# Patient Record
Sex: Female | Born: 1980
Health system: Southern US, Community
[De-identification: ages and names within clinical notes are randomized; demographics above are authoritative.]

## PROBLEM LIST (undated history)

## (undated) ENCOUNTER — Inpatient Hospital Stay (HOSPITAL_COMMUNITY): Payer: Self-pay

## (undated) DIAGNOSIS — D649 Anemia, unspecified: Secondary | ICD-10-CM

## (undated) DIAGNOSIS — N83209 Unspecified ovarian cyst, unspecified side: Secondary | ICD-10-CM

## (undated) DIAGNOSIS — R002 Palpitations: Secondary | ICD-10-CM

## (undated) DIAGNOSIS — R87629 Unspecified abnormal cytological findings in specimens from vagina: Secondary | ICD-10-CM

## (undated) DIAGNOSIS — I10 Essential (primary) hypertension: Secondary | ICD-10-CM

## (undated) HISTORY — PX: TUBAL LIGATION: SHX77

## (undated) HISTORY — PX: LEEP: SHX91

## (undated) HISTORY — PX: INDUCED ABORTION: SHX677

## (undated) HISTORY — PX: TONSILLECTOMY: SUR1361

---

## 2000-09-30 ENCOUNTER — Other Ambulatory Visit: Admission: RE | Admit: 2000-09-30 | Discharge: 2000-09-30 | Payer: Self-pay | Admitting: Obstetrics

## 2000-09-30 ENCOUNTER — Inpatient Hospital Stay (HOSPITAL_COMMUNITY): Admission: AD | Admit: 2000-09-30 | Discharge: 2000-09-30 | Payer: Self-pay | Admitting: Obstetrics & Gynecology

## 2000-10-08 ENCOUNTER — Observation Stay (HOSPITAL_COMMUNITY): Admission: AD | Admit: 2000-10-08 | Discharge: 2000-10-09 | Payer: Self-pay | Admitting: *Deleted

## 2000-10-08 ENCOUNTER — Encounter: Payer: Self-pay | Admitting: *Deleted

## 2000-12-09 ENCOUNTER — Emergency Department (HOSPITAL_COMMUNITY): Admission: EM | Admit: 2000-12-09 | Discharge: 2000-12-09 | Payer: Self-pay | Admitting: Emergency Medicine

## 2000-12-13 ENCOUNTER — Encounter: Payer: Self-pay | Admitting: Emergency Medicine

## 2000-12-13 ENCOUNTER — Emergency Department (HOSPITAL_COMMUNITY): Admission: EM | Admit: 2000-12-13 | Discharge: 2000-12-13 | Payer: Self-pay | Admitting: Emergency Medicine

## 2001-12-26 ENCOUNTER — Inpatient Hospital Stay (HOSPITAL_COMMUNITY): Admission: AD | Admit: 2001-12-26 | Discharge: 2001-12-26 | Payer: Self-pay | Admitting: *Deleted

## 2002-04-24 ENCOUNTER — Inpatient Hospital Stay (HOSPITAL_COMMUNITY): Admission: AD | Admit: 2002-04-24 | Discharge: 2002-04-24 | Payer: Self-pay | Admitting: *Deleted

## 2002-04-24 ENCOUNTER — Encounter: Payer: Self-pay | Admitting: *Deleted

## 2002-06-21 ENCOUNTER — Other Ambulatory Visit: Admission: RE | Admit: 2002-06-21 | Discharge: 2002-06-21 | Payer: Self-pay | Admitting: Obstetrics and Gynecology

## 2002-06-21 ENCOUNTER — Other Ambulatory Visit: Admission: RE | Admit: 2002-06-21 | Discharge: 2002-06-21 | Payer: Self-pay | Admitting: *Deleted

## 2002-08-15 ENCOUNTER — Inpatient Hospital Stay (HOSPITAL_COMMUNITY): Admission: AD | Admit: 2002-08-15 | Discharge: 2002-08-15 | Payer: Self-pay | Admitting: Obstetrics and Gynecology

## 2002-11-16 ENCOUNTER — Inpatient Hospital Stay (HOSPITAL_COMMUNITY): Admission: AD | Admit: 2002-11-16 | Discharge: 2002-11-18 | Payer: Self-pay | Admitting: Obstetrics and Gynecology

## 2005-01-12 ENCOUNTER — Ambulatory Visit: Payer: Self-pay | Admitting: Obstetrics and Gynecology

## 2005-02-11 ENCOUNTER — Ambulatory Visit: Payer: Self-pay | Admitting: Obstetrics and Gynecology

## 2005-02-11 ENCOUNTER — Other Ambulatory Visit: Admission: RE | Admit: 2005-02-11 | Discharge: 2005-02-11 | Payer: Self-pay | Admitting: Obstetrics and Gynecology

## 2005-02-11 ENCOUNTER — Encounter (INDEPENDENT_AMBULATORY_CARE_PROVIDER_SITE_OTHER): Payer: Self-pay | Admitting: Specialist

## 2005-02-18 ENCOUNTER — Ambulatory Visit: Payer: Self-pay | Admitting: Obstetrics and Gynecology

## 2005-03-21 ENCOUNTER — Emergency Department (HOSPITAL_COMMUNITY): Admission: EM | Admit: 2005-03-21 | Discharge: 2005-03-21 | Payer: Self-pay | Admitting: Emergency Medicine

## 2006-11-03 ENCOUNTER — Emergency Department (HOSPITAL_COMMUNITY): Admission: EM | Admit: 2006-11-03 | Discharge: 2006-11-03 | Payer: Self-pay | Admitting: Emergency Medicine

## 2007-02-06 ENCOUNTER — Inpatient Hospital Stay (HOSPITAL_COMMUNITY): Admission: AD | Admit: 2007-02-06 | Discharge: 2007-02-07 | Payer: Self-pay | Admitting: Obstetrics & Gynecology

## 2007-02-13 ENCOUNTER — Other Ambulatory Visit: Admission: RE | Admit: 2007-02-13 | Discharge: 2007-02-13 | Payer: Self-pay | Admitting: Obstetrics and Gynecology

## 2007-03-22 ENCOUNTER — Inpatient Hospital Stay (HOSPITAL_COMMUNITY): Admission: AD | Admit: 2007-03-22 | Discharge: 2007-03-22 | Payer: Self-pay | Admitting: Obstetrics & Gynecology

## 2007-03-22 ENCOUNTER — Ambulatory Visit: Payer: Self-pay | Admitting: Physician Assistant

## 2007-04-05 ENCOUNTER — Ambulatory Visit: Payer: Self-pay | Admitting: *Deleted

## 2007-04-19 ENCOUNTER — Ambulatory Visit: Payer: Self-pay | Admitting: *Deleted

## 2007-04-25 ENCOUNTER — Inpatient Hospital Stay (HOSPITAL_COMMUNITY): Admission: AD | Admit: 2007-04-25 | Discharge: 2007-04-26 | Payer: Self-pay | Admitting: Gynecology

## 2007-04-25 ENCOUNTER — Ambulatory Visit: Payer: Self-pay | Admitting: Physician Assistant

## 2007-05-10 ENCOUNTER — Ambulatory Visit: Payer: Self-pay | Admitting: Obstetrics & Gynecology

## 2007-05-24 ENCOUNTER — Ambulatory Visit: Payer: Self-pay | Admitting: *Deleted

## 2007-05-31 ENCOUNTER — Ambulatory Visit: Payer: Self-pay | Admitting: Obstetrics & Gynecology

## 2007-06-07 ENCOUNTER — Ambulatory Visit: Payer: Self-pay | Admitting: Obstetrics & Gynecology

## 2007-06-14 ENCOUNTER — Ambulatory Visit: Payer: Self-pay | Admitting: *Deleted

## 2007-06-14 ENCOUNTER — Ambulatory Visit: Payer: Self-pay | Admitting: Obstetrics & Gynecology

## 2007-06-14 ENCOUNTER — Inpatient Hospital Stay (HOSPITAL_COMMUNITY): Admission: AD | Admit: 2007-06-14 | Discharge: 2007-06-15 | Payer: Self-pay | Admitting: Obstetrics and Gynecology

## 2008-03-23 ENCOUNTER — Inpatient Hospital Stay (HOSPITAL_COMMUNITY): Admission: AD | Admit: 2008-03-23 | Discharge: 2008-03-24 | Payer: Self-pay | Admitting: Family Medicine

## 2010-03-23 ENCOUNTER — Emergency Department (HOSPITAL_COMMUNITY): Admission: EM | Admit: 2010-03-23 | Discharge: 2010-03-23 | Payer: Self-pay | Admitting: Emergency Medicine

## 2010-12-04 NOTE — Group Therapy Note (Signed)
Allison Drake, SCHIRALDI NO.:  [id number]   MEDICAL RECORD NO.:  [id number]          PATIENT TYPE:  WOC   LOCATION:  [facility]                   FACILITY:  [facility]   PHYSICIAN:  Argentina Donovan, MD        DATE OF BIRTH:  April 11, 1981   DATE OF SERVICE:                                    CLINIC NOTE   HISTORY OF PRESENT ILLNESS:  The patient is a 30 year old gravida 1, para 1-  0-0-1 on Depo-Provera who had an abnormal Pap smear and then a colposcopy at  the health department showing severe dysplasia, CIN 3.  We have discussed in  detail with the patient the etiology, the progress of the disease and the  necessity for her to quit smoking.  She has seen the film on LEEP, and we  are going to schedule her for a LEEP conization of the cervix.   IMPRESSION:  1.  Cervical intraepithelial neoplasia, grade 3.  2.  Severe dysplasia.       PR/MEDQ  D:  01/12/2005  T:  01/12/2005  Job:  [id number]

## 2010-12-04 NOTE — H&P (Signed)
[name], Allison Drake                         ACCOUNT NO.:  [id number]   MEDICAL RECORD NO.:  [id number]                   PATIENT TYPE:  INP   LOCATION:  9165                                 [facility]ACILITY:  [facility]   PHYSICIAN:  Crist Fat. Rivard, M.D.              DATE OF BIRTH:  03-Sep-1980   DATE OF ADMISSION:  11/16/2002  DATE OF DISCHARGE:                                HISTORY & PHYSICAL   HISTORY OF PRESENT ILLNESS:  This is a 30 year old gravida 1, para 0, at 77-  6/7 weeks who presents with complaints of regular uterine contractions since  3 a.m.  She denies leaking or bleeding, reports positive fetal movement.  After arrival to the hospital, her membranes ruptured spontaneously for  clear fluid.  Pregnancy has been followed by the nurse midwife service, and  remarkable for late to care, first trimester marijuana, history of  cryosurgery, history of irregular heart beat, first trimester dental x-ray,  sickle cell trait, Group B Strep positive.   OBSTETRICAL HISTORY:  The patient is primigravida.   PAST MEDICAL HISTORY:  1. History of abnormal Pap smear with cryosurgery in 2002.  2. History of Chlamydia in 2002 which was treated.  3. History of adulthood varicella.  4. History of an irregular heart beat which has been evaluated in the past     with no significant findings.  5. The patient was a previous smoker, but stopped in 10/03.   FAMILY HISTORY:  Remarkable for heart disease in her father.  Stroke in her  grandfather.  Migraines in her aunt.   PAST SURGICAL HISTORY:  Tonsillectomy.   GENETIC HISTORY:  Remarkable for the patient's brother's children who have  extra digits, and the patient who has sickle cell trait.  The patient's  father has sickle cell trait.   SOCIAL HISTORY:  The patient is single.  Was involved with [employer]es and  Narda Amber.  She does not report a religious affiliation.  She works  in Restaurant manager, fast food.  She denies any tobacco, alcohol, or drug use  currently.   PHYSICAL EXAMINATION:  VITAL SIGNS:  Stable, afebrile.  HEENT:  Within normal limits.  NECK:  Thyroid normal and not enlarged.  LUNGS:  Clear to auscultation.  HEART:  Regular rate and rhythm.  ABDOMEN:  Gravid at 38 cm, EFM shows a non-reactive, but generally  reassuring fetal heart rate.  Uterine contractions every two to four  minutes, mild to moderate.  PELVIC:  Cervical examination:  1 to 2 cm, 75% effaced, -2 station, and  vertex presentation.  Rupture of membranes, clear fluid leaking from the  vagina.  EXTREMITIES:  Within normal limits.   ASSESSMENT:  1. Intrauterine pregnancy at 38-3/7 weeks.  2. Early labor.  3. Spontaneous rupture of membranes.  4. Group B Strep positive.   PLAN:  1. Admit to birthing suite.  2. Routine CMN orders.  3. Penicillin prophylaxis.  Marie L. Williams, C.N.M.                 Crist Fat Rivard, M.D.    MLW/MEDQ  D:  11/16/2002  T:  11/16/2002  Job:  [id number]

## 2011-04-21 LAB — URINE MICROSCOPIC-ADD ON

## 2011-04-21 LAB — POCT PREGNANCY, URINE: Preg Test, Ur: NEGATIVE

## 2011-04-21 LAB — URINALYSIS, ROUTINE W REFLEX MICROSCOPIC
Bilirubin Urine: NEGATIVE
Glucose, UA: NEGATIVE
Ketones, ur: 15 — AB
Specific Gravity, Urine: >1.03 — ABNORMAL HIGH
pH: 6

## 2011-04-21 LAB — GC/CHLAMYDIA PROBE AMP, GENITAL: GC Probe Amp, Genital: NEGATIVE

## 2011-04-27 LAB — POCT URINALYSIS DIP (DEVICE)
Bilirubin Urine: NEGATIVE
Glucose, UA: NEGATIVE
Ketones, ur: NEGATIVE
Nitrite: NEGATIVE
Operator id: 297281
Operator id: 148111
Operator id: 29728
Protein, ur: NEGATIVE
Specific Gravity, Urine: 1.02
Specific Gravity, Urine: 1.025
Specific Gravity, Urine: >=1.03
Urobilinogen, UA: 2 — ABNORMAL HIGH

## 2011-04-27 LAB — CBC
Hemoglobin: 11.4 — ABNORMAL LOW
MCHC: 34.4
MCV: 81.6
RBC: 4.06
WBC: 18.1 — ABNORMAL HIGH

## 2011-04-27 LAB — RPR: RPR Ser Ql: NONREACTIVE

## 2011-04-28 LAB — POCT URINALYSIS DIP (DEVICE)
Glucose, UA: NEGATIVE
Ketones, ur: 40 — AB
Operator id: 120861
Specific Gravity, Urine: >=1.03

## 2011-04-29 LAB — URINALYSIS, ROUTINE W REFLEX MICROSCOPIC
Ketones, ur: NEGATIVE
Nitrite: NEGATIVE
pH: 5.5

## 2011-04-29 LAB — POCT URINALYSIS DIP (DEVICE)
Bilirubin Urine: NEGATIVE
Glucose, UA: NEGATIVE
Ketones, ur: NEGATIVE
Operator id: 120861
Protein, ur: NEGATIVE
Specific Gravity, Urine: 1.02

## 2011-04-29 LAB — GC/CHLAMYDIA PROBE AMP, GENITAL: Chlamydia, DNA Probe: NEGATIVE

## 2011-04-29 LAB — WET PREP, GENITAL: Clue Cells Wet Prep HPF POC: NONE SEEN

## 2011-04-29 LAB — URINE MICROSCOPIC-ADD ON

## 2011-05-03 LAB — GC/CHLAMYDIA PROBE AMP, GENITAL
Chlamydia, DNA Probe: NEGATIVE
GC Probe Amp, Genital: NEGATIVE

## 2011-05-03 LAB — URINALYSIS, ROUTINE W REFLEX MICROSCOPIC
Ketones, ur: NEGATIVE
Nitrite: NEGATIVE
Protein, ur: NEGATIVE
pH: 6.5

## 2011-05-03 LAB — URINE CULTURE

## 2011-05-03 LAB — CBC
HCT: 36
Hemoglobin: 12.1
MCHC: 33.4
MCV: 81.6
RDW: 12.7

## 2011-09-30 ENCOUNTER — Encounter (HOSPITAL_COMMUNITY): Payer: Self-pay | Admitting: *Deleted

## 2011-09-30 ENCOUNTER — Emergency Department (HOSPITAL_COMMUNITY)
Admission: EM | Admit: 2011-09-30 | Discharge: 2011-09-30 | Disposition: A | Discharge status: Home / Self Care | Payer: Medicaid Other | Attending: Emergency Medicine | Admitting: Emergency Medicine

## 2011-09-30 DIAGNOSIS — K089 Disorder of teeth and supporting structures, unspecified: Secondary | ICD-10-CM | POA: Insufficient documentation

## 2011-09-30 DIAGNOSIS — F17200 Nicotine dependence, unspecified, uncomplicated: Secondary | ICD-10-CM | POA: Insufficient documentation

## 2011-09-30 DIAGNOSIS — K0889 Other specified disorders of teeth and supporting structures: Secondary | ICD-10-CM

## 2011-09-30 MED ORDER — PENICILLIN V POTASSIUM 250 MG PO TABS: 250.0000 mg | ORAL_TABLET | Freq: Four times a day (QID) | ORAL | Status: AC

## 2011-09-30 MED ORDER — HYDROCODONE-ACETAMINOPHEN 5-325 MG PO TABS: 1.0000 | ORAL_TABLET | ORAL | Status: DC | PRN

## 2011-09-30 MED ORDER — HYDROCODONE-ACETAMINOPHEN 5-325 MG PO TABS: 1.0000 | ORAL_TABLET | Freq: Four times a day (QID) | ORAL | Status: AC | PRN

## 2011-09-30 MED ORDER — PENICILLIN V POTASSIUM 250 MG PO TABS
250.0000 mg | ORAL_TABLET | Freq: Once | ORAL | Status: AC
  Administered 2011-09-30: 250 mg via ORAL
  Filled 2011-09-30: qty 1

## 2011-09-30 NOTE — ED Provider Notes (Signed)
History     CSN: [id number]  Arrival date & time 09/30/11  1940   First MD Initiated Contact with Patient 09/30/11 2204      Chief Complaint  Patient presents with  . Dental Pain    (Consider location/radiation/quality/duration/timing/severity/associated sxs/prior treatment) Patient is a 31 y.o. female presenting with tooth pain. The history is provided by the patient. No language interpreter was used.  Dental PainThe primary symptoms include mouth pain. The symptoms began 5 to 7 days ago. The symptoms are worsening. The symptoms are recurrent. The symptoms occur frequently.  Additional symptoms include: dental sensitivity to temperature and jaw pain. Additional symptoms do not include: trouble swallowing and swollen glands.  Patient reports intermittent pain to right upper maxilla for many months.  Filling has fallen out of previously repaired tooth.  Patient does not currently have a dentist.  History reviewed. No pertinent past medical history.  History reviewed. No pertinent past surgical history.  History reviewed. No pertinent family history.  History  Substance Use Topics  . Smoking status: Current Everyday Smoker  . Smokeless tobacco: Not on file  . Alcohol Use: No    OB History    Grav Para Term Preterm Abortions TAB SAB Ect Mult Living                  Review of Systems  HENT: Positive for dental problem. Negative for trouble swallowing.   All other systems reviewed and are negative.    Allergies  Review of patient's allergies indicates no known allergies.  Home Medications   Current Outpatient Rx  Name Route Sig Dispense Refill  . ACETAMINOPHEN 500 MG PO TABS Oral Take 1,000 mg by mouth every 6 (six) hours as needed. For pain    . BC HEADACHE POWDER PO Oral Take 2 packets by mouth daily as needed. For pain      BP 133/82  Pulse 68  Temp(Src) 98.2 F (36.8 C) (Oral)  Resp 18  SpO2 100%  Physical Exam  Nursing note and vitals  reviewed. Constitutional: She is oriented to person, place, and time. She appears well-developed and well-nourished.  HENT:  Head: Normocephalic.  Mouth/Throat:    Eyes: Pupils are equal, round, and reactive to light.  Neck: Normal range of motion. Neck supple.  Cardiovascular: Normal rate, regular rhythm, normal heart sounds and intact distal pulses.   Pulmonary/Chest: Effort normal and breath sounds normal.  Abdominal: Soft. Bowel sounds are normal.  Musculoskeletal: Normal range of motion.  Lymphadenopathy:    She has no cervical adenopathy.  Neurological: She is alert and oriented to person, place, and time.  Skin: Skin is warm and dry.  Psychiatric: She has a normal mood and affect. Her behavior is normal. Judgment and thought content normal.    ED Course  Procedures (including critical care time)  Labs Reviewed - No data to display No results found.   No diagnosis found.  Dental pain.  MDM          Jimmye Norman, NP 09/30/11 [phone]

## 2011-09-30 NOTE — ED Notes (Signed)
States her filling bell out over a year. States she does not have dental insurance and has been taking otc for the pain. She took to [facility] powder with not relief. Rates pain as 7/10. Located on right upper.

## 2011-09-30 NOTE — ED Provider Notes (Signed)
Medical screening examination/treatment/procedure(s) were performed by non-physician practitioner and as supervising physician I was immediately available for consultation/collaboration.  Juliet Rude. Rubin Payor, MD 09/30/11 [phone]

## 2011-09-30 NOTE — ED Notes (Signed)
Toothache for the past hour

## 2011-09-30 NOTE — Discharge Instructions (Signed)
Dental Pain A tooth ache may be caused by cavities (tooth decay). Cavities expose the nerve of the tooth to air and hot or cold temperatures. It may come from an infection or abscess (also called a boil or furuncle) around your tooth. It is also often caused by dental caries (tooth decay). This causes the pain you are having. DIAGNOSIS  Your caregiver can diagnose this problem by exam. TREATMENT   If caused by an infection, it may be treated with medications which kill germs (antibiotics) and pain medications as prescribed by your caregiver. Take medications as directed.   Only take over-the-counter or prescription medicines for pain, discomfort, or fever as directed by your caregiver.   Whether the tooth ache today is caused by infection or dental disease, you should see your dentist as soon as possible for further care.  SEEK MEDICAL CARE IF: The exam and treatment you received today has been provided on an emergency basis only. This is not a substitute for complete medical or dental care. If your problem worsens or new problems (symptoms) appear, and you are unable to meet with your dentist, call or return to this location. SEEK IMMEDIATE MEDICAL CARE IF:   You have a fever.   You develop redness and swelling of your face, jaw, or neck.   You are unable to open your mouth.   You have severe pain uncontrolled by pain medicine.  MAKE SURE YOU:   Understand these instructions.   Will watch your condition.   Will get help right away if you are not doing well or get worse.  Document Released: 07/05/2005 Document Revised: 06/24/2011 Document Reviewed: 02/21/2008 [facility] Patient Information 2012 [place], [place].  BE SURE TO CALL THE DENTIST TOMORROW--IF THE OFFICE IS NOT OPEN, CALL FIRST THING Monday morning.

## 2013-07-19 NOTE — L&D Delivery Note (Signed)
I was present for delivery and agree with note above. Walidah N Muhammad, CNM  

## 2013-07-19 NOTE — L&D Delivery Note (Signed)
Delivery Note At 9:14 AM a viable female was delivered via Vaginal, Spontaneous Delivery (Presentation: ; Occiput Anterior).  APGAR: 9, 9; weight .   Placenta status: Intact, Spontaneous.  Cord: 3 vessels with the following complications: None.  Cord pH: n/a   Anesthesia: None  Episiotomy: n/a Lacerations: n/a  Suture Repair: n/a Est. Blood Loss (mL):   Mom to postpartum.  Baby to Couplet care / Skin to Skin.  Pt pushed with good maternal effort to deliver a liveborn female via NSVD with spontaneous cry.   Baby placed on maternal abdomen.  Delayed cord clamping performed.  Cord cut by father.  Placenta delivered intact with 3V cord via traction and pitocin.  No complications.  Mom and baby to postpartum.   Rosemarie Ax 12/04/2013, 9:47 AM

## 2013-09-12 ENCOUNTER — Inpatient Hospital Stay (HOSPITAL_COMMUNITY)
Admission: AD | Admit: 2013-09-12 | Discharge: 2013-09-12 | Disposition: A | Discharge status: Home / Self Care | Payer: Medicaid Other | Source: Ambulatory Visit | Attending: Obstetrics and Gynecology | Admitting: Obstetrics and Gynecology

## 2013-09-12 ENCOUNTER — Encounter (HOSPITAL_COMMUNITY): Payer: Self-pay

## 2013-09-12 DIAGNOSIS — O99330 Smoking (tobacco) complicating pregnancy, unspecified trimester: Secondary | ICD-10-CM | POA: Diagnosis not present

## 2013-09-12 DIAGNOSIS — O2390 Unspecified genitourinary tract infection in pregnancy, unspecified trimester: Secondary | ICD-10-CM | POA: Insufficient documentation

## 2013-09-12 DIAGNOSIS — N390 Urinary tract infection, site not specified: Secondary | ICD-10-CM | POA: Diagnosis not present

## 2013-09-12 DIAGNOSIS — O368390 Maternal care for abnormalities of the fetal heart rate or rhythm, unspecified trimester, not applicable or unspecified: Secondary | ICD-10-CM | POA: Diagnosis not present

## 2013-09-12 HISTORY — DX: Palpitations: R00.2

## 2013-09-12 HISTORY — DX: Unspecified abnormal cytological findings in specimens from vagina: R87.629

## 2013-09-12 LAB — RAPID URINE DRUG SCREEN, HOSP PERFORMED
Amphetamines: NOT DETECTED
Barbiturates: NOT DETECTED
Benzodiazepines: NOT DETECTED
COCAINE: NOT DETECTED
OPIATES: NOT DETECTED
Tetrahydrocannabinol: NOT DETECTED

## 2013-09-12 LAB — COMPREHENSIVE METABOLIC PANEL
ALBUMIN: 3.1 g/dL — AB (ref 3.5–5.2)
ALK PHOS: 64 U/L (ref 39–117)
ALT: 13 U/L (ref 0–35)
AST: 14 U/L (ref 0–37)
BILIRUBIN TOTAL: 0.6 mg/dL (ref 0.3–1.2)
BUN: 5 mg/dL — AB (ref 6–23)
CHLORIDE: 97 meq/L (ref 96–112)
CO2: 22 mEq/L (ref 19–32)
Calcium: 8.9 mg/dL (ref 8.4–10.5)
Creatinine, Ser: 0.52 mg/dL (ref 0.50–1.10)
GFR calc Af Amer: >90 mL/min (ref >90)
GFR calc non Af Amer: >90 mL/min (ref >90)
GLUCOSE: 81 mg/dL (ref 70–99)
POTASSIUM: 4.2 meq/L (ref 3.7–5.3)
SODIUM: 133 meq/L — AB (ref 137–147)
TOTAL PROTEIN: 6.6 g/dL (ref 6.0–8.3)

## 2013-09-12 LAB — CBC
HEMATOCRIT: 29.3 % — AB (ref 36.0–46.0)
HEMOGLOBIN: 10.3 g/dL — AB (ref 12.0–15.0)
MCH: 27.6 pg (ref 26.0–34.0)
MCHC: 35.2 g/dL (ref 30.0–36.0)
MCV: 78.6 fL (ref 78.0–100.0)
Platelets: 215 10*3/uL (ref 150–400)
RBC: 3.73 MIL/uL — ABNORMAL LOW (ref 3.87–5.11)
RDW: 13.8 % (ref 11.5–15.5)
WBC: 24 10*3/uL — AB (ref 4.0–10.5)

## 2013-09-12 LAB — URINE MICROSCOPIC-ADD ON

## 2013-09-12 LAB — URINALYSIS, ROUTINE W REFLEX MICROSCOPIC
GLUCOSE, UA: NEGATIVE mg/dL
Hgb urine dipstick: NEGATIVE
KETONES UR: 40 mg/dL — AB
NITRITE: NEGATIVE
PH: 6 (ref 5.0–8.0)
Protein, ur: NEGATIVE mg/dL
SPECIFIC GRAVITY, URINE: 1.025 (ref 1.005–1.030)
Urobilinogen, UA: 4 mg/dL — ABNORMAL HIGH (ref 0.0–1.0)

## 2013-09-12 MED ORDER — ONDANSETRON HCL 4 MG PO TABS: 4.0000 mg | ORAL_TABLET | Freq: Three times a day (TID) | ORAL | Status: DC | PRN

## 2013-09-12 MED ORDER — ACETAMINOPHEN 500 MG PO TABS
1000.0000 mg | ORAL_TABLET | Freq: Four times a day (QID) | ORAL | Status: DC | PRN
  Administered 2013-09-12: 1000 mg via ORAL
  Filled 2013-09-12: qty 2

## 2013-09-12 MED ORDER — THERA VITAL M PO TABS: 1.0000 | ORAL_TABLET | Freq: Every day | ORAL | Status: DC

## 2013-09-12 MED ORDER — CEFTRIAXONE SODIUM 1 G IJ SOLR
1.0000 g | INTRAMUSCULAR | Status: DC
  Filled 2013-09-12: qty 10

## 2013-09-12 MED ORDER — ONDANSETRON HCL 4 MG PO TABS
4.0000 mg | ORAL_TABLET | Freq: Once | ORAL | Status: AC
  Administered 2013-09-12: 4 mg via ORAL
  Filled 2013-09-12: qty 1

## 2013-09-12 MED ORDER — CEPHALEXIN 500 MG PO CAPS: 500.0000 mg | ORAL_CAPSULE | Freq: Three times a day (TID) | ORAL | Status: DC

## 2013-09-12 MED ORDER — CEFTRIAXONE SODIUM 1 G IJ SOLR
1.0000 g | Freq: Once | INTRAMUSCULAR | Status: AC
  Administered 2013-09-12: 1 g via INTRAMUSCULAR
  Filled 2013-09-12: qty 10

## 2013-09-12 NOTE — MAU Provider Note (Signed)
Chief Complaint:  Abdominal Pain and Back Pain  Allison Drake is a 33 y.o.  [id number] with IUP at unknown [place] (LMP ~ July '14) with no prenatal care presenting for Abdominal Pain and Back Pain She reports diffuse abdominal pain starting at 10:00 last night. The pain has been irregular but similar to previous contractions, and accompanied by lower back pain that has been present for the past several weeks. She denies vaginal bleeding or LOF, and notes good fetal movement for the last 1-2 months. She denies any fevers, cough, dysuria or vomiting. She report nausea and feels like she is dehydrated due to decreased fluid intake. She continues to smoke but denies alcohol or illicit drug use.   Menstrual History: OB History   Grav Para Term Preterm Abortions TAB SAB Ect Mult Living   4 2 2  1 1  0 0 0 2      Menarche age:  Patient's last menstrual period was 01/31/2013.    Past Medical History  Diagnosis Date  . Palpitations   . Vaginal Pap smear, abnormal     LEEP    Past Surgical History  Procedure Laterality Date  . Induced abortion      Family History  Problem Relation Age of Onset  . Cancer Maternal Uncle   . Miscarriages / Stillbirths Maternal Grandmother   . Heart disease Maternal Grandmother   . Miscarriages / Stillbirths Maternal Grandfather   . Heart disease Maternal Grandfather   . Heart disease Paternal Grandfather   . Miscarriages / Stillbirths Paternal Grandfather     History  Substance Use Topics  . Smoking status: Current Every Day Smoker -- 0.50 packs/day for 14 years    Types: Cigarettes  . Smokeless tobacco: Never Used  . Alcohol Use: No     No Known Allergies  No prescriptions prior to admission   Review of Systems - Negative except for what is mentioned in HPI.  Physical Exam  Blood pressure 103/46, pulse 105, temperature 99.9 F (37.7 C), temperature source Oral, resp. rate 18, height 5\' 9"  (1.753 m), weight 87.454 kg (192 lb 12.8 oz), last  menstrual period 01/31/2013, SpO2 100.00%. GENERAL: Well-developed, well-nourished female in no acute distress.  LUNGS: Clear to auscultation bilaterally.  HEART: Mild tachycardia in 110s w/ Regular rhythm; No m/r/g; peripheral pulse 2+, CR < 3 secs ABDOMEN: Soft, nontender, nondistended, gravid. Fundal height 26 cm  Back: No CVA tenderness EXTREMITIES: Nontender, no edema, 2+ distal pulses. FHT:  Baseline rate 165-170 bpm   Variability moderate  Accelerations absent   Decelerations none Contractions: None on monitor   Labs: Results for orders placed during the hospital encounter of 09/12/13 (from the past 24 hour(s))  URINALYSIS, ROUTINE W REFLEX MICROSCOPIC   Collection Time    09/12/13  5:29 PM      Result Value Ref Range   Color, Urine AMBER (*) YELLOW   APPearance CLEAR  CLEAR   Specific Gravity, Urine 1.025  1.005 - 1.030   pH 6.0  5.0 - 8.0   Glucose, UA NEGATIVE  NEGATIVE mg/dL   Hgb urine dipstick NEGATIVE  NEGATIVE   Bilirubin Urine SMALL (*) NEGATIVE   Ketones, ur 40 (*) NEGATIVE mg/dL   Protein, ur NEGATIVE  NEGATIVE mg/dL   Urobilinogen, UA 4.0 (*) 0.0 - 1.0 mg/dL   Nitrite NEGATIVE  NEGATIVE   Leukocytes, UA MODERATE (*) NEGATIVE  URINE MICROSCOPIC-ADD ON   Collection Time    09/12/13  5:29 PM  Result Value Ref Range   Squamous Epithelial / LPF FEW (*) RARE   WBC, UA 11-20  <3 WBC/hpf   RBC / HPF 3-6  <3 RBC/hpf   Bacteria, UA MANY (*) RARE   Urine-Other MUCOUS PRESENT    CBC   Collection Time    09/12/13  5:40 PM      Result Value Ref Range   WBC 24.0 (*) 4.0 - 10.5 K/uL   RBC 3.73 (*) 3.87 - 5.11 MIL/uL   Hemoglobin 10.3 (*) 12.0 - 15.0 g/dL   HCT 29.3 (*) 36.0 - 46.0 %   MCV 78.6  78.0 - 100.0 fL   MCH 27.6  26.0 - 34.0 pg   MCHC 35.2  30.0 - 36.0 g/dL   RDW 13.8  11.5 - 15.5 %   Platelets 215  150 - 400 K/uL  COMPREHENSIVE METABOLIC PANEL   Collection Time    09/12/13  5:40 PM      Result Value Ref Range   Sodium 133 (*) 137 - 147 mEq/L    Potassium 4.2  3.7 - 5.3 mEq/L   Chloride 97  96 - 112 mEq/L   CO2 22  19 - 32 mEq/L   Glucose, Bld 81  70 - 99 mg/dL   BUN 5 (*) 6 - 23 mg/dL   Creatinine, Ser 0.52  0.50 - 1.10 mg/dL   Calcium 8.9  8.4 - 10.5 mg/dL   Total Protein 6.6  6.0 - 8.3 g/dL   Albumin 3.1 (*) 3.5 - 5.2 g/dL   AST 14  0 - 37 U/L   ALT 13  0 - 35 U/L   Alkaline Phosphatase 64  39 - 117 U/L   Total Bilirubin 0.6  0.3 - 1.2 mg/dL   GFR calc non Af Amer >90  >90 mL/min   GFR calc Af Amer >90  >90 mL/min    Imaging Studies:  No results found.  Assessment: Allison Drake is  33 y.o. [id number] at IUP at unknown [place] (LMP ~ July '14) with no prenatal care presenting with diffuse irregular ctx like abdominal pain and lower back pain. Mild Maternal and Fetal tachycardia. UA positive for for UTI and WBC elevated.   Plan: UTI  - Fetal tachycardia resolved with Tylenol; Cat 1 tracing on d/c - Given CTX IM in MAU and Rx for Keflex 500 mg TID x 7 days - Zofran for nausea   Pregnancy w/o prenatal care and anemia - Sent email to schedule ob visit at [facility] and dating [place] - Rx for prenatal vitamins with iron  Phill Myron 2/25/20157:54 PM  Plan of care made with Dr Glo Herring. SHAW, KIMBERLY 1:50 AM 09/13/2013

## 2013-09-12 NOTE — MAU Note (Signed)
Pains started last night, gotten stronger and closer. No leaking or bleeding

## 2013-09-12 NOTE — MAU Note (Signed)
Patient states she has had no prenatal care. States she started having back and abdominal pain like contractions last night. Denies bleeding or leaking and reports good fetal movement. Denies S/S of flu, N/V/D.

## 2013-09-12 NOTE — Discharge Instructions (Signed)
Pregnancy and Urinary Tract Infection °A urinary tract infection (UTI) is a bacterial infection of the urinary tract. Infection of the urinary tract can include the ureters, kidneys (pyelonephritis), bladder (cystitis), and urethra (urethritis). All pregnant women should be screened for bacteria in the urinary tract. Identifying and treating a UTI will decrease the risk of preterm labor and developing more serious infections in both the mother and baby. °CAUSES °Bacteria germs cause almost all UTIs.  °RISK FACTORS °Many factors can increase your chances of getting a UTI during pregnancy. These include: °· Having a short urethra. °· Poor toilet and hygiene habits. °· Sexual intercourse. °· Blockage of urine along the urinary tract. °· Problems with the pelvic muscles or nerves. °· Diabetes. °· Obesity. °· Bladder problems after having several children. °· Previous history of UTI. °SIGNS AND SYMPTOMS  °· Pain, burning, or a stinging feeling when urinating. °· Suddenly feeling the need to urinate right away (urgency). °· Loss of bladder control (urinary incontinence). °· Frequent urination, more than is common with pregnancy. °· Lower abdominal or back discomfort. °· Cloudy urine. °· Blood in the urine (hematuria). °· Fever.  °When the kidneys are infected, the symptoms may be: °· Back pain. °· Flank pain on the right side more so than the left. °· Fever. °· Chills. °· Nausea. °· Vomiting. °DIAGNOSIS  °A urinary tract infection is usually diagnosed through urine tests. Additional tests and procedures are sometimes done. These may include: °· Ultrasound exam of the kidneys, ureters, bladder, and urethra. °· Looking in the bladder with a lighted tube (cystoscopy). °TREATMENT °Typically, UTIs can be treated with antibiotic medicines.  °HOME CARE INSTRUCTIONS  °· Only take over-the-counter or prescription medicines as directed by your health care provider. If you were prescribed antibiotics, take them as directed. Finish  them even if you start to feel better. °· Drink enough fluids to keep your urine clear or pale yellow. °· Do not have sexual intercourse until the infection is gone and your health care provider says it is okay. °· Make sure you are tested for UTIs throughout your pregnancy. These infections often come back.  °Preventing a UTI in the Future °· Practice good toilet habits. Always wipe from front to back. Use the tissue only once. °· Do not hold your urine. Empty your bladder as soon as possible when the urge comes. °· Do not douche or use deodorant sprays. °· Wash with soap and warm water around the genital area and the anus. °· Empty your bladder before and after sexual intercourse. °· Wear underwear with a cotton crotch. °· Avoid caffeine and carbonated drinks. They can irritate the bladder. °· Drink cranberry juice or take cranberry pills. This may decrease the risk of getting a UTI. °· Do not drink alcohol. °· Keep all your appointments and tests as scheduled.  °SEEK MEDICAL CARE IF:  °· Your symptoms get worse. °· You are still having fevers 2 or more days after treatment begins. °· You have a rash. °· You feel that you are having problems with medicines prescribed. °· You have abnormal vaginal discharge. °SEEK IMMEDIATE MEDICAL CARE IF:  °· You have back or flank pain. °· You have chills. °· You have blood in your urine. °· You have nausea and vomiting. °· You have contractions of your uterus. °· You have a gush of fluid from the vagina. °MAKE SURE YOU: °· Understand these instructions.   °· Will watch your condition.   °· Will get help right away if you are not doing   pain.  · You have chills.  · You have blood in your urine.  · You have nausea and vomiting.  · You have contractions of your uterus.  · You have a gush of fluid from the vagina.  MAKE SURE YOU:  · Understand these instructions.    · Will watch your condition.    · Will get help right away if you are not doing well or get worse.    Document Released: 10/30/2010 Document Revised: 04/25/2013 Document Reviewed: 02/01/2013  [employer]® Patient Information ©2014 [employer].

## 2013-09-12 NOTE — MAU Note (Signed)
Finally collected urine, is cloudy and dark.

## 2013-09-12 NOTE — MAU Note (Signed)
Skin hot to touch. Rechecked temp

## 2013-09-15 NOTE — MAU Provider Note (Signed)
Attestation of Attending Supervision of Advanced Practitioner: Evaluation and management procedures were performed by the PA/NP/CNM/OB Fellow under my supervision/collaboration. Chart reviewed and agree with management and plan.  Jonnie Kind 09/15/2013 7:43 PM

## 2013-09-27 ENCOUNTER — Other Ambulatory Visit (HOSPITAL_COMMUNITY): Payer: Self-pay | Admitting: Family Medicine

## 2013-09-27 ENCOUNTER — Other Ambulatory Visit: Payer: Self-pay

## 2013-09-27 ENCOUNTER — Ambulatory Visit (HOSPITAL_COMMUNITY)
Admission: RE | Admit: 2013-09-27 | Discharge: 2013-09-27 | Disposition: A | Discharge status: Home / Self Care | Payer: Medicaid Other | Source: Ambulatory Visit | Attending: Obstetrics and Gynecology | Admitting: Obstetrics and Gynecology

## 2013-09-27 DIAGNOSIS — Z1389 Encounter for screening for other disorder: Secondary | ICD-10-CM

## 2013-09-27 DIAGNOSIS — Z3689 Encounter for other specified antenatal screening: Secondary | ICD-10-CM | POA: Diagnosis present

## 2013-09-27 DIAGNOSIS — O0930 Supervision of pregnancy with insufficient antenatal care, unspecified trimester: Secondary | ICD-10-CM

## 2013-09-27 DIAGNOSIS — Z3490 Encounter for supervision of normal pregnancy, unspecified, unspecified trimester: Secondary | ICD-10-CM

## 2013-09-27 DIAGNOSIS — N390 Urinary tract infection, site not specified: Secondary | ICD-10-CM

## 2013-09-28 ENCOUNTER — Encounter: Payer: Self-pay | Admitting: Obstetrics and Gynecology

## 2013-09-28 DIAGNOSIS — O3440 Maternal care for other abnormalities of cervix, unspecified trimester: Secondary | ICD-10-CM | POA: Insufficient documentation

## 2013-09-28 DIAGNOSIS — D270 Benign neoplasm of right ovary: Secondary | ICD-10-CM | POA: Insufficient documentation

## 2013-09-28 DIAGNOSIS — Z9889 Other specified postprocedural states: Secondary | ICD-10-CM

## 2013-10-07 ENCOUNTER — Inpatient Hospital Stay (HOSPITAL_COMMUNITY)
Admission: AD | Admit: 2013-10-07 | Discharge: 2013-10-08 | Disposition: A | Discharge status: Home / Self Care | Payer: Medicaid Other | Source: Ambulatory Visit | Attending: Obstetrics and Gynecology | Admitting: Obstetrics and Gynecology

## 2013-10-07 ENCOUNTER — Encounter (HOSPITAL_COMMUNITY): Payer: Self-pay

## 2013-10-07 DIAGNOSIS — I499 Cardiac arrhythmia, unspecified: Secondary | ICD-10-CM

## 2013-10-07 DIAGNOSIS — O9989 Other specified diseases and conditions complicating pregnancy, childbirth and the puerperium: Principal | ICD-10-CM

## 2013-10-07 DIAGNOSIS — R002 Palpitations: Secondary | ICD-10-CM | POA: Insufficient documentation

## 2013-10-07 DIAGNOSIS — O99330 Smoking (tobacco) complicating pregnancy, unspecified trimester: Secondary | ICD-10-CM | POA: Insufficient documentation

## 2013-10-07 DIAGNOSIS — O99891 Other specified diseases and conditions complicating pregnancy: Secondary | ICD-10-CM | POA: Insufficient documentation

## 2013-10-07 LAB — URINALYSIS, ROUTINE W REFLEX MICROSCOPIC
Bilirubin Urine: NEGATIVE
Glucose, UA: NEGATIVE mg/dL
Ketones, ur: 15 mg/dL — AB
Nitrite: NEGATIVE
PH: 6 (ref 5.0–8.0)
Protein, ur: NEGATIVE mg/dL
SPECIFIC GRAVITY, URINE: 1.02 (ref 1.005–1.030)
Urobilinogen, UA: 0.2 mg/dL (ref 0.0–1.0)

## 2013-10-07 LAB — URINE MICROSCOPIC-ADD ON

## 2013-10-07 NOTE — Discharge Instructions (Signed)
Palpitations  A palpitation is the feeling that your heartbeat is irregular. It may feel like your heart is fluttering or skipping a beat. It may also feel like your heart is beating faster than normal. This is usually not a serious problem. In some cases, you may need more medical tests. HOME CARE  Avoid:  Caffeine in coffee, tea, soft drinks, diet pills, and energy drinks.  Chocolate.  Alcohol.  Stop smoking if you smoke.  Reduce your stress and anxiety. Try:  A method that measures bodily functions so you can learn to control them (biofeedback).  Yoga.  Meditation.  Physical activity such as swimming, jogging, or walking.  Get plenty of rest and sleep. GET HELP RIGHT AWAY IF:   You have chest pain.  You feel short of breath.  You have a very bad headache.  You feel dizzy or pass out (faint).  Your fast or irregular heartbeat continues after 24 hours.  Your palpitations occur more often. MAKE SURE YOU:   Understand these instructions.  Will watch your condition.  Will get help right away if you are not doing well or get worse. Document Released: 04/13/2008 Document Revised: 01/04/2012 Document Reviewed: 09/03/2011 [facility] Patient Information 2014 [place].  Third Trimester of Pregnancy The third trimester is from week 29 through week 42, months 7 through 9. This trimester is when your unborn baby (fetus) is growing very fast. At the end of the ninth month, the unborn baby is about 20 inches in length. It weighs about 6 10 pounds.  HOME CARE   Avoid all smoking, herbs, and alcohol. Avoid drugs not approved by your doctor.  Only take medicine as told by your doctor. Some medicines are safe and some are not during pregnancy.  Exercise only as told by your doctor. Stop exercising if you start having cramps.  Eat regular, healthy meals.  Wear a good support bra if your breasts are tender.  Do not use hot tubs, steam rooms, or saunas.  Wear your  seat belt when driving.  Avoid raw meat, uncooked cheese, and liter boxes and soil used by cats.  Take your prenatal vitamins.  Try taking medicine that helps you poop (stool softener) as needed, and if your doctor approves. Eat more fiber by eating fresh fruit, vegetables, and whole grains. Drink enough fluids to keep your pee (urine) clear or pale yellow.  Take warm water baths (sitz baths) to soothe pain or discomfort caused by hemorrhoids. Use hemorrhoid cream if your doctor approves.  If you have puffy, bulging veins (varicose veins), wear support hose. Raise (elevate) your feet for 15 minutes, 3 4 times a day. Limit salt in your diet.  Avoid heavy lifting, wear low heels, and sit up straight.  Rest with your legs raised if you have leg cramps or low back pain.  Visit your dentist if you have not gone during your pregnancy. Use a soft toothbrush to brush your teeth. Be gentle when you floss.  You can have sex (intercourse) unless your doctor tells you not to.  Do not travel far distances unless you must. Only do so with your doctor's approval.  Take prenatal classes.  Practice driving to the hospital.  Pack your hospital bag.  Prepare the baby's room.  Go to your doctor visits. GET HELP IF:  You are not sure if you are in labor or if your water has broken.  You are dizzy.  You have mild cramps or pressure in your lower belly (  abdominal).  You have a nagging pain in your belly area.  You continue to feel sick to your stomach (nauseous), throw up (vomit), or have watery poop (diarrhea).  You have bad smelling fluid coming from your vagina.  You have pain with peeing (urination). GET HELP RIGHT AWAY IF:   You have a fever.  You are leaking fluid from your vagina.  You are spotting or bleeding from your vagina.  You have severe belly cramping or pain.  You lose or gain weight rapidly.  You have trouble catching your breath and have chest pain.  You notice  sudden or extreme puffiness (swelling) of your face, hands, ankles, feet, or legs.  You have not felt the baby move in over an hour.  You have severe headaches that do not go away with medicine.  You have vision changes. Document Released: 09/29/2009 Document Revised: 10/30/2012 Document Reviewed: 09/05/2012 [facility] Patient Information 2014 [place], [place].

## 2013-10-07 NOTE — MAU Provider Note (Signed)
History   CSN: [id number]  Arrival date and time: 10/07/13 2208   First Provider Initiated Contact with Patient 10/07/13 2304      Chief Complaint  Patient presents with  . Dizziness  . Irregular Heart Beat   Dizziness   Allison Drake has been having palpitations from 5pm. She has what she describes as a history of Supraventricular tachycardia, but I wasn't able verify with her medical record. She is taking no medications at present, has recently established for prenatal care at roughly her 30-33 week pregnancy. At time of interview, palpitations have resolved.   Denies VB, ctx, LOF. +FM. No ShOB, LOC, pre-syncope, changes in speech or visual fields, altered sensorium, or recent illness or trauma.    OB History   Grav Para Term Preterm Abortions TAB SAB Ect Mult Living   4 2 2  1 1  0 0 0 2      Past Medical History  Diagnosis Date  . Palpitations   . Vaginal Pap smear, abnormal     LEEP    Past Surgical History  Procedure Laterality Date  . Induced abortion    . Leep      Family History  Problem Relation Age of Onset  . Cancer Maternal Uncle   . Miscarriages / Stillbirths Maternal Grandmother   . Heart disease Maternal Grandmother   . Miscarriages / Stillbirths Maternal Grandfather   . Heart disease Maternal Grandfather   . Heart disease Paternal Grandfather   . Miscarriages / Stillbirths Paternal Grandfather     History  Substance Use Topics  . Smoking status: Current Every Day Smoker -- 0.50 packs/day for 14 years    Types: Cigarettes  . Smokeless tobacco: Never Used  . Alcohol Use: No    Allergies: No Known Allergies  Prescriptions prior to admission  Medication Sig Dispense Refill  . Multiple Vitamins-Minerals (MULTIVITAMIN) tablet Take 1 tablet by mouth daily.  30 tablet  0  . phenylephrine-shark liver oil-mineral oil-petrolatum (PREPARATION H) 0.25-3-14-71.9 % rectal ointment Place 1 application rectally 2 (two) times daily as needed for hemorrhoids.       . cephALEXin (KEFLEX) 500 MG capsule Take 1 capsule (500 mg total) by mouth 3 (three) times daily.  21 capsule  0  . ondansetron (ZOFRAN) 4 MG tablet Take 1 tablet (4 mg total) by mouth every 8 (eight) hours as needed for nausea or vomiting.  20 tablet  0    Review of Systems  Neurological: Positive for dizziness.   See HPI Physical Exam   Blood pressure 118/68, pulse 101, temperature 97.9 F (36.6 C), temperature source Oral, resp. rate 18, height 5\' 9"  (1.753 m), weight 89.086 kg (196 lb 6.4 oz), last menstrual period 01/31/2013, SpO2 98.00%.  Physical Exam  Constitutional: She is oriented to person, place, and time. She appears well-developed and well-nourished. No distress.  HENT:  Head: Normocephalic and atraumatic.  Neck:  No bruit  Cardiovascular: Normal rate, regular rhythm and normal heart sounds.  Exam reveals no gallop and no friction rub.   No murmur heard. Respiratory: Effort normal and breath sounds normal. No respiratory distress.  GI: Bowel sounds are normal. There is no tenderness. There is no rebound and no guarding.  Musculoskeletal: Normal range of motion. She exhibits no edema and no tenderness.  Neurological: She is alert and oriented to person, place, and time. She has normal reflexes.  Skin: Skin is warm and dry. No rash noted. No erythema.  Psychiatric: She has a normal  mood and affect. Her behavior is normal.  Fundus: 33 cm   MAU Course  Procedures  Results for orders placed during the hospital encounter of 10/07/13 (from the past 24 hour(s))  URINALYSIS, ROUTINE W REFLEX MICROSCOPIC     Status: Abnormal   Collection Time    10/07/13 10:14 PM      Result Value Ref Range   Color, Urine YELLOW  YELLOW   APPearance CLEAR  CLEAR   Specific Gravity, Urine 1.020  1.005 - 1.030   pH 6.0  5.0 - 8.0   Glucose, UA NEGATIVE  NEGATIVE mg/dL   Hgb urine dipstick TRACE (*) NEGATIVE   Bilirubin Urine NEGATIVE  NEGATIVE   Ketones, ur 15 (*) NEGATIVE mg/dL    Protein, ur NEGATIVE  NEGATIVE mg/dL   Urobilinogen, UA 0.2  0.0 - 1.0 mg/dL   Nitrite NEGATIVE  NEGATIVE   Leukocytes, UA MODERATE (*) NEGATIVE  URINE MICROSCOPIC-ADD ON     Status: Abnormal   Collection Time    10/07/13 10:14 PM      Result Value Ref Range   Squamous Epithelial / LPF FEW (*) RARE   WBC, UA 11-20  <3 WBC/hpf   RBC / HPF 3-6  <3 RBC/hpf   Bacteria, UA FEW (*) RARE    FHT: Baseline 140; moderate variability; Accelerations; no decels.  TOCO: No contractions. Possible uterine irritability.   EKG: NSR, no QT-prolongation.  Assessment and Plan  Allison Drake is a 33 y.o. [id number] @ [redacted]w[redacted]d presents for palpitations.   # Palpitations; may represent SVT, but not present when patient seen. May also represent anxiety, dehydration, or stress.  - EKG wnl   #Gestation;  -  routine follow up;     Jacques Earthly 10/07/2013, 11:35 PM

## 2013-10-07 NOTE — MAU Note (Signed)
Pt c/o palpatations, dizziness and weakness that began at 5pm after she had gotten out of the shower. States she laid down to rest to see if it would help and it did not. Has had this problem before in the past and was seeing a Dr. before she lost her job and insurance. She does not take any medications for it. Starts PNC at the clinic on 3/26.

## 2013-10-08 ENCOUNTER — Encounter (HOSPITAL_COMMUNITY): Payer: Self-pay | Admitting: Family

## 2013-10-08 DIAGNOSIS — I499 Cardiac arrhythmia, unspecified: Secondary | ICD-10-CM

## 2013-10-08 NOTE — MAU Provider Note (Signed)
I examined pt and agree with documentation above and resident plan of care.  Pt has appointment in [facility] in the upcoming week. [facility]

## 2013-10-10 LAB — URINE CULTURE

## 2013-10-11 ENCOUNTER — Ambulatory Visit (INDEPENDENT_AMBULATORY_CARE_PROVIDER_SITE_OTHER): Payer: Medicaid Other | Admitting: Obstetrics & Gynecology

## 2013-10-11 ENCOUNTER — Encounter: Payer: Self-pay | Admitting: Obstetrics & Gynecology

## 2013-10-11 ENCOUNTER — Other Ambulatory Visit (HOSPITAL_COMMUNITY)
Admission: RE | Admit: 2013-10-11 | Discharge: 2013-10-11 | Disposition: A | Discharge status: Home / Self Care | Payer: Medicaid Other | Source: Ambulatory Visit | Attending: Obstetrics & Gynecology | Admitting: Obstetrics & Gynecology

## 2013-10-11 VITALS — BP 117/67 | Temp 97.7°F | Wt 194.3 lb

## 2013-10-11 DIAGNOSIS — Z113 Encounter for screening for infections with a predominantly sexual mode of transmission: Secondary | ICD-10-CM | POA: Insufficient documentation

## 2013-10-11 DIAGNOSIS — Z124 Encounter for screening for malignant neoplasm of cervix: Secondary | ICD-10-CM | POA: Insufficient documentation

## 2013-10-11 DIAGNOSIS — R002 Palpitations: Secondary | ICD-10-CM

## 2013-10-11 DIAGNOSIS — D270 Benign neoplasm of right ovary: Secondary | ICD-10-CM

## 2013-10-11 DIAGNOSIS — D279 Benign neoplasm of unspecified ovary: Secondary | ICD-10-CM

## 2013-10-11 DIAGNOSIS — Z1151 Encounter for screening for human papillomavirus (HPV): Secondary | ICD-10-CM | POA: Insufficient documentation

## 2013-10-11 DIAGNOSIS — O0930 Supervision of pregnancy with insufficient antenatal care, unspecified trimester: Secondary | ICD-10-CM

## 2013-10-11 DIAGNOSIS — O3433 Maternal care for cervical incompetence, third trimester: Secondary | ICD-10-CM

## 2013-10-11 DIAGNOSIS — O3440 Maternal care for other abnormalities of cervix, unspecified trimester: Secondary | ICD-10-CM

## 2013-10-11 DIAGNOSIS — O0933 Supervision of pregnancy with insufficient antenatal care, third trimester: Secondary | ICD-10-CM

## 2013-10-11 LAB — POCT URINALYSIS DIP (DEVICE)
BILIRUBIN URINE: NEGATIVE
GLUCOSE, UA: NEGATIVE mg/dL
Ketones, ur: NEGATIVE mg/dL
Nitrite: NEGATIVE
Protein, ur: NEGATIVE mg/dL
UROBILINOGEN UA: 1 mg/dL (ref 0.0–1.0)
pH: 6 (ref 5.0–8.0)

## 2013-10-11 MED ORDER — BETAMETHASONE SOD PHOS & ACET 6 (3-3) MG/ML IJ SUSP: 12.0000 mg | Freq: Every day | INTRAMUSCULAR | Status: DC

## 2013-10-11 MED ORDER — BETAMETHASONE SOD PHOS & ACET 6 (3-3) MG/ML IJ SUSP
12.0000 mg | Freq: Every day | INTRAMUSCULAR | Status: AC
  Administered 2013-10-11 – 2013-10-12 (×2): 12 mg via INTRAMUSCULAR

## 2013-10-11 NOTE — Progress Notes (Signed)
Late prenatal care.  Due date is by 31 weeks [place] (Pt unsure of LMP).  History of LEEP--pap today.  History of palpitations--seen by [facility] in the past--will refer back.  Needs BTL papers--sign today.  Cervical exam 2/50  Will give beta today, rpt tomorrow with ffn and cervical exam.  Seeing nutrition and SW today.  OB prenatal panel and GCT (draw GCT then give betamethasone.)  Wants circ--give list of options.  Dermoid noted on [place].  Need to determine if BTL and cystectomy will be done 4 weeks pp or do BTL while in house pp and then interval cystectomy.  Pt needs to discuss with provider at next visit.

## 2013-10-11 NOTE — Progress Notes (Signed)
Nutrition note: 1st visit consult Pt has gained 6.3# @ [redacted]w[redacted]d, which is < expected. Pt reports eating 2 meals & 1 snack/d. Pt is taking a PNV. Iron from 09/12/13 is low. Pt reports no N/V but has some heartburn. Pt received verbal & written education on general nutrition during pregnancy. Discussed tips to decrease heartburn. Encouraged energy dense snacks. Encouraged iron rich foods. Discussed benefits/ importance of BF. Discussed wt gain goals of 15-25# or 0.6#/wk. Pt agrees to continue taking a PNV. Pt does not have [place] but plans to apply. Pt does not plan to BF but is willing to think about it. F/u in 2-4 wks Vladimir Faster, MS, RD, LDN, [facility]

## 2013-10-11 NOTE — Progress Notes (Signed)
Pulse- 92 Patient reports occasional lower back pain

## 2013-10-11 NOTE — Progress Notes (Signed)
Cardiology appointment scheduled with Dr. Johnsie Cancel at [facility] 10/15/13 at 1045 am.

## 2013-10-12 ENCOUNTER — Ambulatory Visit (INDEPENDENT_AMBULATORY_CARE_PROVIDER_SITE_OTHER): Payer: Medicaid Other | Admitting: Obstetrics & Gynecology

## 2013-10-12 ENCOUNTER — Encounter: Payer: Self-pay | Admitting: Advanced Practice Midwife

## 2013-10-12 VITALS — BP 115/69 | Temp 97.6°F

## 2013-10-12 DIAGNOSIS — O2390 Unspecified genitourinary tract infection in pregnancy, unspecified trimester: Secondary | ICD-10-CM

## 2013-10-12 DIAGNOSIS — N390 Urinary tract infection, site not specified: Secondary | ICD-10-CM

## 2013-10-12 DIAGNOSIS — K219 Gastro-esophageal reflux disease without esophagitis: Secondary | ICD-10-CM

## 2013-10-12 DIAGNOSIS — O2340 Unspecified infection of urinary tract in pregnancy, unspecified trimester: Secondary | ICD-10-CM

## 2013-10-12 DIAGNOSIS — B373 Candidiasis of vulva and vagina: Secondary | ICD-10-CM

## 2013-10-12 DIAGNOSIS — B3731 Acute candidiasis of vulva and vagina: Secondary | ICD-10-CM

## 2013-10-12 DIAGNOSIS — O3430 Maternal care for cervical incompetence, unspecified trimester: Secondary | ICD-10-CM

## 2013-10-12 DIAGNOSIS — D279 Benign neoplasm of unspecified ovary: Secondary | ICD-10-CM

## 2013-10-12 DIAGNOSIS — O0993 Supervision of high risk pregnancy, unspecified, third trimester: Secondary | ICD-10-CM | POA: Insufficient documentation

## 2013-10-12 DIAGNOSIS — O0990 Supervision of high risk pregnancy, unspecified, unspecified trimester: Secondary | ICD-10-CM

## 2013-10-12 DIAGNOSIS — O0930 Supervision of pregnancy with insufficient antenatal care, unspecified trimester: Secondary | ICD-10-CM

## 2013-10-12 DIAGNOSIS — B951 Streptococcus, group B, as the cause of diseases classified elsewhere: Secondary | ICD-10-CM | POA: Insufficient documentation

## 2013-10-12 DIAGNOSIS — R002 Palpitations: Secondary | ICD-10-CM

## 2013-10-12 LAB — OBSTETRIC PANEL
ANTIBODY SCREEN: NEGATIVE
BASOS ABS: 0 10*3/uL (ref 0.0–0.1)
Basophils Relative: 0 % (ref 0–1)
EOS PCT: 1 % (ref 0–5)
Eosinophils Absolute: 0.1 10*3/uL (ref 0.0–0.7)
HEMATOCRIT: 31.5 % — AB (ref 36.0–46.0)
HEMOGLOBIN: 10.9 g/dL — AB (ref 12.0–15.0)
Hepatitis B Surface Ag: NEGATIVE
LYMPHS PCT: 14 % (ref 12–46)
Lymphs Abs: 1.4 10*3/uL (ref 0.7–4.0)
MCH: 27.3 pg (ref 26.0–34.0)
MCHC: 34.6 g/dL (ref 30.0–36.0)
MCV: 78.8 fL (ref 78.0–100.0)
MONO ABS: 1.1 10*3/uL — AB (ref 0.1–1.0)
MONOS PCT: 11 % (ref 3–12)
Neutro Abs: 7.2 10*3/uL (ref 1.7–7.7)
Neutrophils Relative %: 74 % (ref 43–77)
Platelets: 256 10*3/uL (ref 150–400)
RBC: 4 MIL/uL (ref 3.87–5.11)
RDW: 14.1 % (ref 11.5–15.5)
Rh Type: POSITIVE
Rubella: 1.71 Index — ABNORMAL HIGH (ref <0.90)
WBC: 9.7 10*3/uL (ref 4.0–10.5)

## 2013-10-12 LAB — PRESCRIPTION MONITORING PROFILE (19 PANEL)
AMPHETAMINE/METH: NEGATIVE ng/mL
BENZODIAZEPINE SCREEN, URINE: NEGATIVE ng/mL
BUPRENORPHINE, URINE: NEGATIVE ng/mL
Barbiturate Screen, Urine: NEGATIVE ng/mL
Cannabinoid Scrn, Ur: NEGATIVE ng/mL
Carisoprodol, Urine: NEGATIVE ng/mL
Cocaine Metabolites: NEGATIVE ng/mL
Creatinine, Urine: 206.17 mg/dL (ref >20.0)
Fentanyl, Ur: NEGATIVE ng/mL
MDMA URINE: NEGATIVE ng/mL
MEPERIDINE UR: NEGATIVE ng/mL
METHAQUALONE SCREEN (URINE): NEGATIVE ng/mL
Methadone Screen, Urine: NEGATIVE ng/mL
Nitrites, Initial: NEGATIVE ug/mL
OPIATE SCREEN, URINE: NEGATIVE ng/mL
Oxycodone Screen, Ur: NEGATIVE ng/mL
PH URINE, INITIAL: 6.1 pH (ref 4.5–8.9)
PROPOXYPHENE: NEGATIVE ng/mL
Phencyclidine, Ur: NEGATIVE ng/mL
TAPENTADOLUR: NEGATIVE ng/mL
TRAMADOL UR: NEGATIVE ng/mL
ZOLPIDEM, URINE: NEGATIVE ng/mL

## 2013-10-12 LAB — POCT URINALYSIS DIP (DEVICE)
BILIRUBIN URINE: NEGATIVE
GLUCOSE, UA: NEGATIVE mg/dL
KETONES UR: 15 mg/dL — AB
NITRITE: NEGATIVE
Protein, ur: NEGATIVE mg/dL
Specific Gravity, Urine: 1.025 (ref 1.005–1.030)
Urobilinogen, UA: 0.2 mg/dL (ref 0.0–1.0)
pH: 5.5 (ref 5.0–8.0)

## 2013-10-12 LAB — GLUCOSE TOLERANCE, 1 HOUR (50G) W/O FASTING: GLUCOSE 1 HOUR GTT: 89 mg/dL (ref 70–140)

## 2013-10-12 LAB — FETAL FIBRONECTIN: Fetal Fibronectin: NEGATIVE

## 2013-10-12 LAB — HIV ANTIBODY (ROUTINE TESTING W REFLEX): HIV: NONREACTIVE

## 2013-10-12 MED ORDER — PANTOPRAZOLE SODIUM 40 MG PO TBEC: 40.0000 mg | DELAYED_RELEASE_TABLET | Freq: Every day | ORAL | Status: DC

## 2013-10-12 MED ORDER — FLUCONAZOLE 150 MG PO TABS: 150.0000 mg | ORAL_TABLET | Freq: Once | ORAL | Status: DC

## 2013-10-12 NOTE — Patient Instructions (Signed)
Heartburn During Pregnancy   Heartburn is a burning sensation in the chest caused by stomach acid backing up into the esophagus. Heartburn is common in pregnancy because a certain hormone (progesterone) is released when a woman is pregnant. The progesterone hormone may relax the valve that separates the esophagus from the stomach. This allows acid to go up into the esophagus, causing heartburn. Heartburn may also happen in pregnancy because the enlarging uterus pushes up on the stomach, which pushes more acid into the esophagus. This is especially true in the later stages of pregnancy. Heartburn problems usually go away after giving birth.  CAUSES   Heartburn is caused by stomach acid backing up into the esophagus. During pregnancy, this may result from various things, including:   · The progesterone hormone.  · Changing hormone levels.  · The growing uterus pushing stomach acid upward.  · Large meals.  · Certain foods and drinks.  · Exercise.  · Increased acid production.  SIGNS AND SYMPTOMS   · Burning pain in the chest or lower throat.  · Bitter taste in the mouth.  · Coughing.  DIAGNOSIS   Your health care provider will typically diagnose heartburn by taking a careful history of your concern. Blood tests may be done to check for a certain type of bacteria that is associated with heartburn. Sometimes, heartburn is diagnosed by prescribing a heartburn medicine to see if the symptoms improve. In some cases, a procedure called an endoscopy may be done. In this procedure, a tube with a light and a camera on the end (endoscope) is used to examine the esophagus and the stomach.  TREATMENT   Treatment will vary depending on the severity of your symptoms. Your health care provider may recommend:  · Over-the-counter medicines (antacids, acid reducers) for mild heartburn.  · Prescription medicines to decrease stomach acid or to protect your stomach lining.  · Certain changes in your diet.  · Elevating the head of your bed  by putting blocks under the legs. This helps prevent stomach acid from backing up into the esophagus when you are lying down.  HOME CARE INSTRUCTIONS   · Only take over-the-counter or prescription medicines as directed by your health care provider.  · Raise the head of your bed by putting blocks under the legs if instructed to do so by your health care provider. Sleeping with more pillows is not effective because it only changes the position of your head.  · Do not exercise right after eating.  · Avoid eating 2 3 hours before bed. Do not lie down right after eating.  · Eat small meals throughout the day instead of three large meals.  · Identify foods and beverages that make your symptoms worse and avoid them. Foods you may want to avoid include:  · Peppers.  · Chocolate.  · High-fat foods, including fried foods.  · Spicy foods.  · Garlic and onions.  · Citrus fruits, including oranges, grapefruit, lemons, and limes.  · Food containing tomatoes or tomato products.  · Mint.  · Carbonated and caffeinated drinks.  · Vinegar.  SEEK MEDICAL CARE IF:  · You have abdominal pain of any kind.  · You feel burning in your upper abdomen or chest, especially after eating or lying down.  · You have nausea and vomiting.  · Your stomach feels upset after you eat.  SEEK IMMEDIATE MEDICAL CARE IF:   · You have severe chest pain that goes down your arm or into your   jaw or neck.  · You feel sweaty, dizzy, or lightheaded.  · You become short of breath.  · You vomit blood.  · You have difficulty or pain with swallowing.  · You have bloody or black, tarry stools.  · You have episodes of heartburn more than 3 times a week, for more than 2 weeks.  MAKE SURE YOU:  · Understand these instructions.  · Will watch your condition.  · Will get help right away if you are not doing well or get worse.  Document Released: 07/02/2000 Document Revised: 04/25/2013 Document Reviewed: 02/21/2013  [employer]® Patient Information ©2014 [employer].

## 2013-10-12 NOTE — Progress Notes (Signed)
Second betamethasone. Rx Protonix 40 mg for heartburn  fFN sent and wet prep, c/w yeast, rx diflucan

## 2013-10-12 NOTE — Progress Notes (Signed)
Pulse-  95 

## 2013-10-13 ENCOUNTER — Inpatient Hospital Stay (HOSPITAL_COMMUNITY)
Admission: AD | Admit: 2013-10-13 | Discharge: 2013-10-13 | Disposition: A | Discharge status: Home / Self Care | Payer: Medicaid Other | Source: Ambulatory Visit | Attending: Obstetrics & Gynecology | Admitting: Obstetrics & Gynecology

## 2013-10-13 ENCOUNTER — Encounter (HOSPITAL_COMMUNITY): Payer: Self-pay | Admitting: *Deleted

## 2013-10-13 DIAGNOSIS — R109 Unspecified abdominal pain: Secondary | ICD-10-CM | POA: Insufficient documentation

## 2013-10-13 DIAGNOSIS — O2340 Unspecified infection of urinary tract in pregnancy, unspecified trimester: Secondary | ICD-10-CM

## 2013-10-13 DIAGNOSIS — B951 Streptococcus, group B, as the cause of diseases classified elsewhere: Secondary | ICD-10-CM

## 2013-10-13 DIAGNOSIS — O99330 Smoking (tobacco) complicating pregnancy, unspecified trimester: Secondary | ICD-10-CM | POA: Insufficient documentation

## 2013-10-13 DIAGNOSIS — O2390 Unspecified genitourinary tract infection in pregnancy, unspecified trimester: Secondary | ICD-10-CM

## 2013-10-13 DIAGNOSIS — N390 Urinary tract infection, site not specified: Secondary | ICD-10-CM

## 2013-10-13 DIAGNOSIS — O4700 False labor before 37 completed weeks of gestation, unspecified trimester: Secondary | ICD-10-CM | POA: Insufficient documentation

## 2013-10-13 LAB — URINALYSIS, ROUTINE W REFLEX MICROSCOPIC
Bilirubin Urine: NEGATIVE
GLUCOSE, UA: NEGATIVE mg/dL
Ketones, ur: NEGATIVE mg/dL
NITRITE: NEGATIVE
PROTEIN: NEGATIVE mg/dL
Specific Gravity, Urine: <1.005 — ABNORMAL LOW (ref 1.005–1.030)
Urobilinogen, UA: 0.2 mg/dL (ref 0.0–1.0)
pH: 6 (ref 5.0–8.0)

## 2013-10-13 LAB — URINE MICROSCOPIC-ADD ON

## 2013-10-13 LAB — WET PREP, GENITAL
Clue Cells Wet Prep HPF POC: NONE SEEN
TRICH WET PREP: NONE SEEN

## 2013-10-13 MED ORDER — NITROFURANTOIN MONOHYD MACRO 100 MG PO CAPS: 100.0000 mg | ORAL_CAPSULE | Freq: Two times a day (BID) | ORAL | Status: DC

## 2013-10-13 NOTE — MAU Provider Note (Signed)
Attestation of Attending Supervision of Obstetric Fellow: Evaluation and management procedures were performed by the Obstetric Fellow under my supervision and collaboration.  I have reviewed the Obstetric Fellow's note and chart, and I agree with the management and plan.  Ceili Boshers, MD, FACOG Attending Obstetrician & Gynecologist Faculty Practice, [facility]   

## 2013-10-13 NOTE — MAU Provider Note (Signed)
Chief Complaint:  Abdominal Pain  Allison Drake is a 33 y.o.  [phone] with IUP at [redacted]w[redacted]d presenting for Abdominal Pain Reports lower pelvic/abdominal pain that feels like cramping; this began yesterday after her cervical check. She denies any fevers, chills, n/v, dysuria or back pain. She denies taking any tylenol or ibuprofen. She denies any vaginal itching or pain.   Denies LOF, VB, or Ctx and notes good FM.   Menstrual History: OB History   Grav Para Term Preterm Abortions TAB SAB Ect Mult Living   4 2 2  0 1 1 0 0 0 2      Patient's last menstrual period was 01/31/2013.     Past Medical History  Diagnosis Date  . Palpitations   . Vaginal Pap smear, abnormal     LEEP    Past Surgical History  Procedure Laterality Date  . Induced abortion    . Leep      Family History  Problem Relation Age of Onset  . Cancer Maternal Uncle   . Miscarriages / Stillbirths Maternal Grandmother   . Heart disease Maternal Grandmother   . Miscarriages / Stillbirths Maternal Grandfather   . Heart disease Maternal Grandfather   . Heart disease Paternal Grandfather   . Miscarriages / Stillbirths Paternal Grandfather     History  Substance Use Topics  . Smoking status: Current Every Day Smoker -- 0.50 packs/day for 14 years    Types: Cigarettes  . Smokeless tobacco: Never Used  . Alcohol Use: No     No Known Allergies  Prescriptions prior to admission  Medication Sig Dispense Refill  . pantoprazole (PROTONIX) 40 MG tablet Take 1 tablet (40 mg total) by mouth daily.  30 tablet  1  . phenylephrine-shark liver oil-mineral oil-petrolatum (PREPARATION H) 0.25-3-14-71.9 % rectal ointment Place 1 application rectally 2 (two) times daily as needed for hemorrhoids.      . Prenatal Vit-Fe Fumarate-FA (PRENATAL MULTIVITAMIN) TABS tablet Take 1 tablet by mouth daily at 12 noon.      . [DISCONTINUED] fluconazole (DIFLUCAN) 150 MG tablet Take 1 tablet (150 mg total) by mouth once.  1 tablet  1     Review of Systems - Negative except for what is mentioned in HPI.  Physical Exam  Blood pressure 100/54, pulse 105, temperature 98.9 F (37.2 C), temperature source Oral, resp. rate 18, weight 89.812 kg (198 lb), last menstrual period 01/31/2013. GENERAL: Well-developed, well-nourished female in no acute distress.  LUNGS: Clear to auscultation bilaterally.  HEART: Regular rate and rhythm. ABDOMEN: Soft, Mild suprapubic tenderness, nondistended, gravid. No CVA tenderness EXTREMITIES: Nontender, no edema, 2+ distal pulses. FHT:  Baseline rate 140 bpm   Variability moderate  Accelerations present   Decelerations none Contractions: Irregular   Labs: Results for orders placed during the hospital encounter of 10/13/13 (from the past 24 hour(s))  URINALYSIS, ROUTINE W REFLEX MICROSCOPIC   Collection Time    10/13/13  6:25 PM      Result Value Ref Range   Color, Urine YELLOW  YELLOW   APPearance CLEAR  CLEAR   Specific Gravity, Urine <1.005 (*) 1.005 - 1.030   pH 6.0  5.0 - 8.0   Glucose, UA NEGATIVE  NEGATIVE mg/dL   Hgb urine dipstick SMALL (*) NEGATIVE   Bilirubin Urine NEGATIVE  NEGATIVE   Ketones, ur NEGATIVE  NEGATIVE mg/dL   Protein, ur NEGATIVE  NEGATIVE mg/dL   Urobilinogen, UA 0.2  0.0 - 1.0 mg/dL   Nitrite NEGATIVE  NEGATIVE   Leukocytes, UA MODERATE (*) NEGATIVE  URINE MICROSCOPIC-ADD ON   Collection Time    10/13/13  6:25 PM      Result Value Ref Range   Squamous Epithelial / LPF FEW (*) RARE   WBC, UA 7-10  <3 WBC/hpf   RBC / HPF 0-2  <3 RBC/hpf   Bacteria, UA FEW (*) RARE    Imaging Studies:  US Ob Comp + 14 Wk  09/27/2013   OBSTETRICAL ULTRASOUND: This exam was performed within a [facility]. The OB US report was generated in the [facility] system, and faxed to the ordering physician.   This report is also available in [employer] and in the [employer]. See [facility] Obstetric US report.   Assessment: Allison Drake is  33  y.o. [phone] at [redacted]w[redacted]d presents with Abdominal Pain   Plan: UTI - OB urine culture = Ecoli sensitives not back - Will treat with Macrobid as she was treated with Keflex for UTI ~ 1 month ago - Will need prophylactic abx if get another UTI this pregnancy  Phill Myron 3/28/20157:46 PM  I spoke with and examined patient and agree with resident's note and plan of care.  Fredrik Rigger, MD OB Fellow 10/13/2013 8:00 PM

## 2013-10-13 NOTE — Discharge Instructions (Signed)
Pregnancy and Urinary Tract Infection °A urinary tract infection (UTI) is a bacterial infection of the urinary tract. Infection of the urinary tract can include the ureters, kidneys (pyelonephritis), bladder (cystitis), and urethra (urethritis). All pregnant women should be screened for bacteria in the urinary tract. Identifying and treating a UTI will decrease the risk of preterm labor and developing more serious infections in both the mother and baby. °CAUSES °Bacteria germs cause almost all UTIs.  °RISK FACTORS °Many factors can increase your chances of getting a UTI during pregnancy. These include: °· Having a short urethra. °· Poor toilet and hygiene habits. °· Sexual intercourse. °· Blockage of urine along the urinary tract. °· Problems with the pelvic muscles or nerves. °· Diabetes. °· Obesity. °· Bladder problems after having several children. °· Previous history of UTI. °SIGNS AND SYMPTOMS  °· Pain, burning, or a stinging feeling when urinating. °· Suddenly feeling the need to urinate right away (urgency). °· Loss of bladder control (urinary incontinence). °· Frequent urination, more than is common with pregnancy. °· Lower abdominal or back discomfort. °· Cloudy urine. °· Blood in the urine (hematuria). °· Fever.  °When the kidneys are infected, the symptoms may be: °· Back pain. °· Flank pain on the right side more so than the left. °· Fever. °· Chills. °· Nausea. °· Vomiting. °DIAGNOSIS  °A urinary tract infection is usually diagnosed through urine tests. Additional tests and procedures are sometimes done. These may include: °· Ultrasound exam of the kidneys, ureters, bladder, and urethra. °· Looking in the bladder with a lighted tube (cystoscopy). °TREATMENT °Typically, UTIs can be treated with antibiotic medicines.  °HOME CARE INSTRUCTIONS  °· Only take over-the-counter or prescription medicines as directed by your health care provider. If you were prescribed antibiotics, take them as directed. Finish  them even if you start to feel better. °· Drink enough fluids to keep your urine clear or pale yellow. °· Do not have sexual intercourse until the infection is gone and your health care provider says it is okay. °· Make sure you are tested for UTIs throughout your pregnancy. These infections often come back.  °Preventing a UTI in the Future °· Practice good toilet habits. Always wipe from front to back. Use the tissue only once. °· Do not hold your urine. Empty your bladder as soon as possible when the urge comes. °· Do not douche or use deodorant sprays. °· Wash with soap and warm water around the genital area and the anus. °· Empty your bladder before and after sexual intercourse. °· Wear underwear with a cotton crotch. °· Avoid caffeine and carbonated drinks. They can irritate the bladder. °· Drink cranberry juice or take cranberry pills. This may decrease the risk of getting a UTI. °· Do not drink alcohol. °· Keep all your appointments and tests as scheduled.  °SEEK MEDICAL CARE IF:  °· Your symptoms get worse. °· You are still having fevers 2 or more days after treatment begins. °· You have a rash. °· You feel that you are having problems with medicines prescribed. °· You have abnormal vaginal discharge. °SEEK IMMEDIATE MEDICAL CARE IF:  °· You have back or flank pain. °· You have chills. °· You have blood in your urine. °· You have nausea and vomiting. °· You have contractions of your uterus. °· You have a gush of fluid from the vagina. °MAKE SURE YOU: °· Understand these instructions.   °· Will watch your condition.   °· Will get help right away if you are not doing   pain.  · You have chills.  · You have blood in your urine.  · You have nausea and vomiting.  · You have contractions of your uterus.  · You have a gush of fluid from the vagina.  MAKE SURE YOU:  · Understand these instructions.    · Will watch your condition.    · Will get help right away if you are not doing well or get worse.    Document Released: 10/30/2010 Document Revised: 04/25/2013 Document Reviewed: 02/01/2013  [employer]® Patient Information ©2014 [employer].

## 2013-10-13 NOTE — MAU Note (Signed)
Patient states she has been having right lower abdominal pain. States she has a cyst on the right side. Denies bleeding or leaking and has fetal movement.

## 2013-10-14 LAB — CULTURE, OB URINE: Colony Count: >=100000

## 2013-10-15 ENCOUNTER — Ambulatory Visit: Payer: Self-pay | Admitting: Cardiovascular Disease

## 2013-10-15 ENCOUNTER — Telehealth: Payer: Self-pay

## 2013-10-15 MED ORDER — FLUCONAZOLE 150 MG PO TABS: 150.0000 mg | ORAL_TABLET | Freq: Once | ORAL | Status: DC

## 2013-10-15 NOTE — Telephone Encounter (Signed)
Message copied by Geanie Logan on Mon Oct 15, 2013  8:49 AM ------      Message from: Woodroe Mode      Created: Sun Oct 14, 2013  9:29 AM       Diflucan 150 mg po for yeast ------

## 2013-10-15 NOTE — Telephone Encounter (Signed)
Called pt. And informed her of yeast infection-- Diflucan sent to pharmacy. Pt. Verbalized understanding and had no further questions or concerns.

## 2013-10-17 ENCOUNTER — Other Ambulatory Visit: Payer: Self-pay | Admitting: Family

## 2013-10-17 ENCOUNTER — Encounter: Payer: Self-pay | Admitting: Family

## 2013-10-18 ENCOUNTER — Encounter: Payer: Self-pay | Admitting: Family Medicine

## 2013-10-18 ENCOUNTER — Ambulatory Visit (INDEPENDENT_AMBULATORY_CARE_PROVIDER_SITE_OTHER): Payer: Medicaid Other | Admitting: Family Medicine

## 2013-10-18 VITALS — BP 117/69 | Wt 194.7 lb

## 2013-10-18 DIAGNOSIS — B951 Streptococcus, group B, as the cause of diseases classified elsewhere: Secondary | ICD-10-CM

## 2013-10-18 DIAGNOSIS — O2340 Unspecified infection of urinary tract in pregnancy, unspecified trimester: Principal | ICD-10-CM

## 2013-10-18 DIAGNOSIS — O0990 Supervision of high risk pregnancy, unspecified, unspecified trimester: Secondary | ICD-10-CM

## 2013-10-18 DIAGNOSIS — N390 Urinary tract infection, site not specified: Secondary | ICD-10-CM

## 2013-10-18 DIAGNOSIS — O2390 Unspecified genitourinary tract infection in pregnancy, unspecified trimester: Secondary | ICD-10-CM

## 2013-10-18 DIAGNOSIS — O0993 Supervision of high risk pregnancy, unspecified, third trimester: Secondary | ICD-10-CM

## 2013-10-18 LAB — POCT URINALYSIS DIP (DEVICE)
Bilirubin Urine: NEGATIVE
GLUCOSE, UA: NEGATIVE mg/dL
HGB URINE DIPSTICK: NEGATIVE
Ketones, ur: NEGATIVE mg/dL
NITRITE: NEGATIVE
Protein, ur: NEGATIVE mg/dL
Specific Gravity, Urine: 1.02 (ref 1.005–1.030)
Urobilinogen, UA: 2 mg/dL — ABNORMAL HIGH (ref 0.0–1.0)
pH: 7 (ref 5.0–8.0)

## 2013-10-18 MED ORDER — HYDROCORTISONE ACE-PRAMOXINE 1-1 % RE FOAM: 1.0000 | Freq: Two times a day (BID) | RECTAL | Status: DC

## 2013-10-18 NOTE — Progress Notes (Signed)
Hemorrhoid pain--trial of anusol HC FFN neg-- Treated for UTI For U/s on Monday

## 2013-10-18 NOTE — Patient Instructions (Signed)
Third Trimester of Pregnancy The third trimester is from week 29 through week 42, months 7 through 9. The third trimester is a time when the fetus is growing rapidly. At the end of the ninth month, the fetus is about 20 inches in length and weighs 6 10 pounds.  BODY CHANGES Your body goes through many changes during pregnancy. The changes vary from woman to woman.   Your weight will continue to increase. You can expect to gain 25 35 pounds (11 16 kg) by the end of the pregnancy.  You may begin to get stretch marks on your hips, abdomen, and breasts.  You may urinate more often because the fetus is moving lower into your pelvis and pressing on your bladder.  You may develop or continue to have heartburn as a result of your pregnancy.  You may develop constipation because certain hormones are causing the muscles that push waste through your intestines to slow down.  You may develop hemorrhoids or swollen, bulging veins (varicose veins).  You may have pelvic pain because of the weight gain and pregnancy hormones relaxing your joints between the bones in your pelvis. Back aches may result from over exertion of the muscles supporting your posture.  Your breasts will continue to grow and be tender. A yellow discharge may leak from your breasts called colostrum.  Your belly button may stick out.  You may feel short of breath because of your expanding uterus.  You may notice the fetus "dropping," or moving lower in your abdomen.  You may have a bloody mucus discharge. This usually occurs a few days to a week before labor begins.  Your cervix becomes thin and soft (effaced) near your due date. WHAT TO EXPECT AT YOUR PRENATAL EXAMS  You will have prenatal exams every 2 weeks until week 36. Then, you will have weekly prenatal exams. During a routine prenatal visit:  You will be weighed to make sure you and the fetus are growing normally.  Your blood pressure is taken.  Your abdomen will  be measured to track your baby's growth.  The fetal heartbeat will be listened to.  Any test results from the previous visit will be discussed.  You may have a cervical check near your due date to see if you have effaced. At around 36 weeks, your caregiver will check your cervix. At the same time, your caregiver will also perform a test on the secretions of the vaginal tissue. This test is to determine if a type of bacteria, Group B streptococcus, is present. Your caregiver will explain this further. Your caregiver may ask you:  What your birth plan is.  How you are feeling.  If you are feeling the baby move.  If you have had any abnormal symptoms, such as leaking fluid, bleeding, severe headaches, or abdominal cramping.  If you have any questions. Other tests or screenings that may be performed during your third trimester include:  Blood tests that check for low iron levels (anemia).  Fetal testing to check the health, activity level, and growth of the fetus. Testing is done if you have certain medical conditions or if there are problems during the pregnancy. FALSE LABOR You may feel small, irregular contractions that eventually go away. These are called Braxton Hicks contractions, or false labor. Contractions may last for hours, days, or even weeks before true labor sets in. If contractions come at regular intervals, intensify, or become painful, it is best to be seen by your caregiver.    SIGNS OF LABOR   Menstrual-like cramps.  Contractions that are 5 minutes apart or less.  Contractions that start on the top of the uterus and spread down to the lower abdomen and back.  A sense of increased pelvic pressure or back pain.  A watery or bloody mucus discharge that comes from the vagina. If you have any of these signs before the 37th week of pregnancy, call your caregiver right away. You need to go to the hospital to get checked immediately. HOME CARE INSTRUCTIONS   Avoid all  smoking, herbs, alcohol, and unprescribed drugs. These chemicals affect the formation and growth of the baby.  Follow your caregiver's instructions regarding medicine use. There are medicines that are either safe or unsafe to take during pregnancy.  Exercise only as directed by your caregiver. Experiencing uterine cramps is a good sign to stop exercising.  Continue to eat regular, healthy meals.  Wear a good support bra for breast tenderness.  Do not use hot tubs, steam rooms, or saunas.  Wear your seat belt at all times when driving.  Avoid raw meat, uncooked cheese, cat litter boxes, and soil used by cats. These carry germs that can cause birth defects in the baby.  Take your prenatal vitamins.  Try taking a stool softener (if your caregiver approves) if you develop constipation. Eat more high-fiber foods, such as fresh vegetables or fruit and whole grains. Drink plenty of fluids to keep your urine clear or pale yellow.  Take warm sitz baths to soothe any pain or discomfort caused by hemorrhoids. Use hemorrhoid cream if your caregiver approves.  If you develop varicose veins, wear support hose. Elevate your feet for 15 minutes, 3 4 times a day. Limit salt in your diet.  Avoid heavy lifting, wear low heal shoes, and practice good posture.  Rest a lot with your legs elevated if you have leg cramps or low back pain.  Visit your dentist if you have not gone during your pregnancy. Use a soft toothbrush to brush your teeth and be gentle when you floss.  A sexual relationship may be continued unless your caregiver directs you otherwise.  Do not travel far distances unless it is absolutely necessary and only with the approval of your caregiver.  Take prenatal classes to understand, practice, and ask questions about the labor and delivery.  Make a trial run to the hospital.  Pack your hospital bag.  Prepare the baby's nursery.  Continue to go to all your prenatal visits as directed  by your caregiver. SEEK MEDICAL CARE IF:  You are unsure if you are in labor or if your water has broken.  You have dizziness.  You have mild pelvic cramps, pelvic pressure, or nagging pain in your abdominal area.  You have persistent nausea, vomiting, or diarrhea.  You have a bad smelling vaginal discharge.  You have pain with urination. SEEK IMMEDIATE MEDICAL CARE IF:   You have a fever.  You are leaking fluid from your vagina.  You have spotting or bleeding from your vagina.  You have severe abdominal cramping or pain.  You have rapid weight loss or gain.  You have shortness of breath with chest pain.  You notice sudden or extreme swelling of your face, hands, ankles, feet, or legs.  You have not felt your baby move in over an hour.  You have severe headaches that do not go away with medicine.  You have vision changes. Document Released: 06/29/2001 Document Revised: 03/07/2013 Document Reviewed:   09/05/2012 [employer] Patient Information 2014 [employer].  Breastfeeding Deciding to breastfeed is one of the best choices you can make for you and your baby. A change in hormones during pregnancy causes your breast tissue to grow and increases the number and size of your milk ducts. These hormones also allow proteins, sugars, and fats from your blood supply to make breast milk in your milk-producing glands. Hormones prevent breast milk from being released before your baby is born as well as prompt milk flow after birth. Once breastfeeding has begun, thoughts of your baby, as well as his or her sucking or crying, can stimulate the release of milk from your milk-producing glands.  BENEFITS OF BREASTFEEDING For Your Baby  Your first milk (colostrum) helps your baby's digestive system function better.   There are antibodies in your milk that help your baby fight off infections.   Your baby has a lower incidence of asthma, allergies, and sudden infant death syndrome.    The nutrients in breast milk are better for your baby than infant formulas and are designed uniquely for your baby's needs.   Breast milk improves your baby's brain development.   Your baby is less likely to develop other conditions, such as childhood obesity, asthma, or type 2 diabetes mellitus.  For You   Breastfeeding helps to create a very special bond between you and your baby.   Breastfeeding is convenient. Breast milk is always available at the correct temperature and costs nothing.   Breastfeeding helps to burn calories and helps you lose the weight gained during pregnancy.   Breastfeeding makes your uterus contract to its prepregnancy size faster and slows bleeding (lochia) after you give birth.   Breastfeeding helps to lower your risk of developing type 2 diabetes mellitus, osteoporosis, and breast or ovarian cancer later in life. SIGNS THAT YOUR BABY IS HUNGRY Early Signs of Hunger  Increased alertness or activity.  Stretching.  Movement of the head from side to side.  Movement of the head and opening of the mouth when the corner of the mouth or cheek is stroked (rooting).  Increased sucking sounds, smacking lips, cooing, sighing, or squeaking.  Hand-to-mouth movements.  Increased sucking of fingers or hands. Late Signs of Hunger  Fussing.  Intermittent crying. Extreme Signs of Hunger Signs of extreme hunger will require calming and consoling before your baby will be able to breastfeed successfully. Do not wait for the following signs of extreme hunger to occur before you initiate breastfeeding:   Restlessness.  A loud, strong cry.   Screaming. BREASTFEEDING BASICS Breastfeeding Initiation  Find a comfortable place to sit or lie down, with your neck and back well supported.  Place a pillow or rolled up blanket under your baby to bring him or her to the level of your breast (if you are seated). Nursing pillows are specially designed to help  support your arms and your baby while you breastfeed.  Make sure that your baby's abdomen is facing your abdomen.   Gently massage your breast. With your fingertips, massage from your chest wall toward your nipple in a circular motion. This encourages milk flow. You may need to continue this action during the feeding if your milk flows slowly.  Support your breast with 4 fingers underneath and your thumb above your nipple. Make sure your fingers are well away from your nipple and your baby's mouth.   Stroke your baby's lips gently with your finger or nipple.   When your baby's mouth is   open wide enough, quickly bring your baby to your breast, placing your entire nipple and as much of the colored area around your nipple (areola) as possible into your baby's mouth.   More areola should be visible above your baby's upper lip than below the lower lip.   Your baby's tongue should be between his or her lower gum and your breast.   Ensure that your baby's mouth is correctly positioned around your nipple (latched). Your baby's lips should create a seal on your breast and be turned out (everted).  It is common for your baby to suck about 2 3 minutes in order to start the flow of breast milk. Latching Teaching your baby how to latch on to your breast properly is very important. An improper latch can cause nipple pain and decreased milk supply for you and poor weight gain in your baby. Also, if your baby is not latched onto your nipple properly, he or she may swallow some air during feeding. This can make your baby fussy. Burping your baby when you switch breasts during the feeding can help to get rid of the air. However, teaching your baby to latch on properly is still the best way to prevent fussiness from swallowing air while breastfeeding. Signs that your baby has successfully latched on to your nipple:    Silent tugging or silent sucking, without causing you pain.   Swallowing heard  between every 3 4 sucks.    Muscle movement above and in front of his or her ears while sucking.  Signs that your baby has not successfully latched on to nipple:   Sucking sounds or smacking sounds from your baby while breastfeeding.  Nipple pain. If you think your baby has not latched on correctly, slip your finger into the corner of your baby's mouth to break the suction and place it between your baby's gums. Attempt breastfeeding initiation again. Signs of Successful Breastfeeding Signs from your baby:   A gradual decrease in the number of sucks or complete cessation of sucking.   Falling asleep.   Relaxation of his or her body.   Retention of a small amount of milk in his or her mouth.   Letting go of your breast by himself or herself. Signs from you:  Breasts that have increased in firmness, weight, and size 1 3 hours after feeding.   Breasts that are softer immediately after breastfeeding.  Increased milk volume, as well as a change in milk consistency and color by the 5th day of breastfeeding.   Nipples that are not sore, cracked, or bleeding. Signs That Your Baby is Getting Enough Milk  Wetting at least 3 diapers in a 24-hour period. The urine should be clear and pale yellow by age 5 days.  At least 3 stools in a 24-hour period by age 5 days. The stool should be soft and yellow.  At least 3 stools in a 24-hour period by age 7 days. The stool should be seedy and yellow.  No loss of weight greater than 10% of birth weight during the first 3 days of age.  Average weight gain of 4 7 ounces (120 210 mL) per week after age 4 days.  Consistent daily weight gain by age 5 days, without weight loss after the age of 2 weeks. After a feeding, your baby may spit up a small amount. This is common. BREASTFEEDING FREQUENCY AND DURATION Frequent feeding will help you make more milk and can prevent sore nipples and breast engorgement.   Breastfeed when you feel the need to  reduce the fullness of your breasts or when your baby shows signs of hunger. This is called "breastfeeding on demand." Avoid introducing a pacifier to your baby while you are working to establish breastfeeding (the first 4 6 weeks after your baby is born). After this time you may choose to use a pacifier. Research has shown that pacifier use during the first year of a baby's life decreases the risk of sudden infant death syndrome (SIDS). Allow your baby to feed on each breast as long as he or she wants. Breastfeed until your baby is finished feeding. When your baby unlatches or falls asleep while feeding from the first breast, offer the second breast. Because newborns are often sleepy in the first few weeks of life, you may need to awaken your baby to get him or her to feed. Breastfeeding times will vary from baby to baby. However, the following rules can serve as a guide to help you ensure that your baby is properly fed:  Newborns (babies 4 weeks of age or younger) may breastfeed every 1 3 hours.  Newborns should not go longer than 3 hours during the day or 5 hours during the night without breastfeeding.  You should breastfeed your baby a minimum of 8 times in a 24-hour period until you begin to introduce solid foods to your baby at around 6 months of age. BREAST MILK PUMPING Pumping and storing breast milk allows you to ensure that your baby is exclusively fed your breast milk, even at times when you are unable to breastfeed. This is especially important if you are going back to work while you are still breastfeeding or when you are not able to be present during feedings. Your lactation consultant can give you guidelines on how long it is safe to store breast milk.  A breast pump is a machine that allows you to pump milk from your breast into a sterile bottle. The pumped breast milk can then be stored in a refrigerator or freezer. Some breast pumps are operated by hand, while others use electricity. Ask  your lactation consultant which type will work best for you. Breast pumps can be purchased, but some hospitals and breastfeeding support groups lease breast pumps on a monthly basis. A lactation consultant can teach you how to hand express breast milk, if you prefer not to use a pump.  CARING FOR YOUR BREASTS WHILE YOU BREASTFEED Nipples can become dry, cracked, and sore while breastfeeding. The following recommendations can help keep your breasts moisturized and healthy:  Avoid using soap on your nipples.   Wear a supportive bra. Although not required, special nursing bras and tank tops are designed to allow access to your breasts for breastfeeding without taking off your entire bra or top. Avoid wearing underwire style bras or extremely tight bras.  Air dry your nipples for 3 4minutes after each feeding.   Use only cotton bra pads to absorb leaked breast milk. Leaking of breast milk between feedings is normal.   Use lanolin on your nipples after breastfeeding. Lanolin helps to maintain your skin's normal moisture barrier. If you use pure lanolin you do not need to wash it off before feeding your baby again. Pure lanolin is not toxic to your baby. You may also hand express a few drops of breast milk and gently massage that milk into your nipples and allow the milk to air dry. In the first few weeks after giving birth, some women   experience extremely full breasts (engorgement). Engorgement can make your breasts feel heavy, warm, and tender to the touch. Engorgement peaks within 3 5 days after you give birth. The following recommendations can help ease engorgement:  Completely empty your breasts while breastfeeding or pumping. You may want to start by applying warm, moist heat (in the shower or with warm water-soaked hand towels) just before feeding or pumping. This increases circulation and helps the milk flow. If your baby does not completely empty your breasts while breastfeeding, pump any extra  milk after he or she is finished.  Wear a snug bra (nursing or regular) or tank top for 1 2 days to signal your body to slightly decrease milk production.  Apply ice packs to your breasts, unless this is too uncomfortable for you.  Make sure that your baby is latched on and positioned properly while breastfeeding. If engorgement persists after 48 hours of following these recommendations, contact your health care provider or a lactation consultant. OVERALL HEALTH CARE RECOMMENDATIONS WHILE BREASTFEEDING  Eat healthy foods. Alternate between meals and snacks, eating 3 of each per day. Because what you eat affects your breast milk, some of the foods may make your baby more irritable than usual. Avoid eating these foods if you are sure that they are negatively affecting your baby.  Drink milk, fruit juice, and water to satisfy your thirst (about 10 glasses a day).   Rest often, relax, and continue to take your prenatal vitamins to prevent fatigue, stress, and anemia.  Continue breast self-awareness checks.  Avoid chewing and smoking tobacco.  Avoid alcohol and drug use. Some medicines that may be harmful to your baby can pass through breast milk. It is important to ask your health care provider before taking any medicine, including all over-the-counter and prescription medicine as well as vitamin and herbal supplements. It is possible to become pregnant while breastfeeding. If birth control is desired, ask your health care provider about options that will be safe for your baby. SEEK MEDICAL CARE IF:   You feel like you want to stop breastfeeding or have become frustrated with breastfeeding.  You have painful breasts or nipples.  Your nipples are cracked or bleeding.  Your breasts are red, tender, or warm.  You have a swollen area on either breast.  You have a fever or chills.  You have nausea or vomiting.  You have drainage other than breast milk from your nipples.  Your breasts  do not become full before feedings by the 5th day after you give birth.  You feel sad and depressed.  Your baby is too sleepy to eat well.  Your baby is having trouble sleeping.   Your baby is wetting less than 3 diapers in a 24-hour period.  Your baby has less than 3 stools in a 24-hour period.  Your baby's skin or the white part of his or her eyes becomes yellow.   Your baby is not gaining weight by 5 days of age. SEEK IMMEDIATE MEDICAL CARE IF:   Your baby is overly tired (lethargic) and does not want to wake up and feed.  Your baby develops an unexplained fever. Document Released: 07/05/2005 Document Revised: 03/07/2013 Document Reviewed: 12/27/2012 [employer] Patient Information 2014 [employer].  

## 2013-10-18 NOTE — Progress Notes (Signed)
P= 90 C/o of pain from hemorrhoids; states uses preparation H every day and doesn't help. Tried stool softeners. Discussed the importance of eating a diet high in fiber as well.  Pt. Declines tdap today; information sheet given.

## 2013-10-22 ENCOUNTER — Ambulatory Visit (HOSPITAL_COMMUNITY)
Admission: RE | Admit: 2013-10-22 | Discharge: 2013-10-22 | Disposition: A | Discharge status: Home / Self Care | Payer: Medicaid Other | Source: Ambulatory Visit | Attending: Obstetrics & Gynecology | Admitting: Obstetrics & Gynecology

## 2013-10-22 DIAGNOSIS — Z3689 Encounter for other specified antenatal screening: Secondary | ICD-10-CM | POA: Diagnosis present

## 2013-10-22 DIAGNOSIS — Z3490 Encounter for supervision of normal pregnancy, unspecified, unspecified trimester: Secondary | ICD-10-CM

## 2013-10-30 ENCOUNTER — Encounter: Payer: Medicaid Other | Admitting: Advanced Practice Midwife

## 2013-10-31 ENCOUNTER — Encounter: Payer: Self-pay | Admitting: Cardiovascular Disease

## 2013-11-01 ENCOUNTER — Ambulatory Visit (INDEPENDENT_AMBULATORY_CARE_PROVIDER_SITE_OTHER): Payer: Medicaid Other | Admitting: Family

## 2013-11-01 VITALS — BP 116/72 | Wt 196.9 lb

## 2013-11-01 DIAGNOSIS — O0993 Supervision of high risk pregnancy, unspecified, third trimester: Secondary | ICD-10-CM

## 2013-11-01 DIAGNOSIS — O2340 Unspecified infection of urinary tract in pregnancy, unspecified trimester: Principal | ICD-10-CM

## 2013-11-01 DIAGNOSIS — N390 Urinary tract infection, site not specified: Secondary | ICD-10-CM

## 2013-11-01 DIAGNOSIS — O2390 Unspecified genitourinary tract infection in pregnancy, unspecified trimester: Secondary | ICD-10-CM

## 2013-11-01 DIAGNOSIS — B951 Streptococcus, group B, as the cause of diseases classified elsewhere: Secondary | ICD-10-CM

## 2013-11-01 DIAGNOSIS — O0990 Supervision of high risk pregnancy, unspecified, unspecified trimester: Secondary | ICD-10-CM

## 2013-11-01 LAB — POCT URINALYSIS DIP (DEVICE)
GLUCOSE, UA: NEGATIVE mg/dL
Ketones, ur: NEGATIVE mg/dL
Nitrite: NEGATIVE
PH: 6 (ref 5.0–8.0)
Protein, ur: 30 mg/dL — AB
Specific Gravity, Urine: 1.025 (ref 1.005–1.030)
Urobilinogen, UA: 2 mg/dL — ABNORMAL HIGH (ref 0.0–1.0)

## 2013-11-01 LAB — OB RESULTS CONSOLE GBS: GBS: POSITIVE

## 2013-11-01 LAB — OB RESULTS CONSOLE GC/CHLAMYDIA
Chlamydia: NEGATIVE
Gonorrhea: NEGATIVE

## 2013-11-01 NOTE — Progress Notes (Signed)
Reports has not had additional arrhythmias, did not see cardiologist because Medicaid was still pending.  Recommended to reschedule since medicaid approved.  Desires BTL and reports signed papers previously.  To Education officer, museum for papers.  GBS and GC/CT today. Declined cervical check.

## 2013-11-01 NOTE — Progress Notes (Signed)
Pulse: 86 Pt reports pain in her foot.

## 2013-11-02 LAB — GC/CHLAMYDIA PROBE AMP
CT Probe RNA: NEGATIVE
GC PROBE AMP APTIMA: NEGATIVE

## 2013-11-03 LAB — CULTURE, BETA STREP (GROUP B ONLY)

## 2013-11-09 ENCOUNTER — Encounter: Payer: Self-pay | Admitting: *Deleted

## 2013-11-09 ENCOUNTER — Ambulatory Visit (INDEPENDENT_AMBULATORY_CARE_PROVIDER_SITE_OTHER): Payer: Medicaid Other | Admitting: Advanced Practice Midwife

## 2013-11-09 VITALS — BP 122/80 | HR 76 | Wt 205.1 lb

## 2013-11-09 DIAGNOSIS — B951 Streptococcus, group B, as the cause of diseases classified elsewhere: Secondary | ICD-10-CM

## 2013-11-09 DIAGNOSIS — O2340 Unspecified infection of urinary tract in pregnancy, unspecified trimester: Principal | ICD-10-CM

## 2013-11-09 DIAGNOSIS — N390 Urinary tract infection, site not specified: Secondary | ICD-10-CM

## 2013-11-09 DIAGNOSIS — O2390 Unspecified genitourinary tract infection in pregnancy, unspecified trimester: Secondary | ICD-10-CM

## 2013-11-09 LAB — POCT URINALYSIS DIP (DEVICE)
Bilirubin Urine: NEGATIVE
Glucose, UA: NEGATIVE mg/dL
KETONES UR: NEGATIVE mg/dL
Nitrite: NEGATIVE
Protein, ur: NEGATIVE mg/dL
Specific Gravity, Urine: 1.025 (ref 1.005–1.030)
UROBILINOGEN UA: 1 mg/dL (ref 0.0–1.0)
pH: 6.5 (ref 5.0–8.0)

## 2013-11-09 NOTE — Progress Notes (Signed)
Feeling well. Baby is active. Reviewed + GBS results. Will need abx in labor. NKDA.

## 2013-11-09 NOTE — Progress Notes (Signed)
9lb weight gain noted, denies headaches or visual changes. States at first no appetite, now eating better

## 2013-11-16 ENCOUNTER — Encounter: Payer: Self-pay | Admitting: Advanced Practice Midwife

## 2013-11-16 ENCOUNTER — Ambulatory Visit: Payer: Medicaid Other | Admitting: Advanced Practice Midwife

## 2013-11-16 VITALS — BP 123/74 | HR 100 | Wt 208.3 lb

## 2013-11-16 DIAGNOSIS — R82998 Other abnormal findings in urine: Secondary | ICD-10-CM

## 2013-11-16 DIAGNOSIS — O2340 Unspecified infection of urinary tract in pregnancy, unspecified trimester: Secondary | ICD-10-CM

## 2013-11-16 DIAGNOSIS — B951 Streptococcus, group B, as the cause of diseases classified elsewhere: Secondary | ICD-10-CM

## 2013-11-16 LAB — POCT URINALYSIS DIP (DEVICE)
Bilirubin Urine: NEGATIVE
GLUCOSE, UA: NEGATIVE mg/dL
Ketones, ur: NEGATIVE mg/dL
NITRITE: NEGATIVE
Protein, ur: NEGATIVE mg/dL
SPECIFIC GRAVITY, URINE: 1.015 (ref 1.005–1.030)
UROBILINOGEN UA: 2 mg/dL — AB (ref 0.0–1.0)
pH: 7 (ref 5.0–8.0)

## 2013-11-16 NOTE — Patient Instructions (Signed)
[place] (Revised August 2014)   Chronic Pain Problems:    [facility]:  [phone]           Patients need to be referred by their primary care doctor/specialist  Insufficient Money for Medicine:           United Way: call "211"     MAP Program at [place] or [place] [phone]            No Primary Care Doctor:  To locate a primary care doctor that accepts your insurance or provides certain services:           [place]: [phone].  Ask about [employer] or discount services.           Physician Referral Service: [phone] ask for "My [place]"   If no insurance, you need to see if you qualify for [facility] "orange card", call to set      up appointment for eligibility/enrollment at [phone] or [phone] or visit [place] ([address] [place], [place] and [place]) to meet with a [facility] enrollment specialist.  Agencies that provide inexpensive (sliding fee scale) medical care:        [facility] - Family Medicine at [place] - [phone]      [facility] - [facility] - [place] [phone]      [facility] - [place] [phone]   [facility] - [place] @ [place] [phone]   [facility] - [place] @ [place] - [phone]   [facility]: [phone]    Women's Clinic: [phone]    Planned Parenthood: [phone]    [facility] [place]    [place] Providers:           [facility] [phone] (No Family Planning accepted)          [address],  [address], [phone], Mon-Fri 9am-5pm          [facility] - [phone]   [place], [address] [place], Mon-Thursday 8am-5pm, Fri 8am-noon   [employer] [place] - [place], [address], Mon-Fri 7:30am-4:30pm          [employer] - [phone]          [address] [facility] - [phone] N. [address]          Only accepts [place] [employer] patients after they have their name applied to their card  Self Pay (no insurance) in [facility]:           Sickle Cell Patients:    [place], [phone] [facility]:   [address] [place] [phone]       [facility]  Health and [place] [phone]  [place]:   [address], [phone]          [facility]           [place], [phone] [facility] for [place], [phone]           [facility] [place]           [place], [address], [place] [address]           [phone], Mon-Fri 9am-7pm, [place] 9am-1pm          [facility] - Family Medicine @ [facility]          [place], [place]          [facility] - [facility]           [address], [place] [facility] Adult and [place]   [address], [place] [phone]          [place], [place]  [facility] Adult and [place]    [place], [place] [phone] [facility] and [place]   [address] [phone]  Dr. Vista Lawman            [address], [place], [facility]           [address] [phone]          [facility]             [address], [phone]          [facility]           [place], [place], 1st & 3rd Saturday every month, 10am-1pm  OTHERS:  [employer]  ([facility] Only)  [phone] (Thursday only)  Strategies for finding a Primary Care Provider:  1) Find a Doctor and Pay Out of Pocket  Although you won't have to find out who is covered by your insurance plan, it is a good idea to ask around and get recommendations. You will then need to call the office and see if the doctor you have chosen will accept you as a new patient and what types of options they offer for patients who are self-pay. Some doctors offer discounts or will set up payment plans for their patients who do not have insurance, but you will need to ask so you aren't surprised when you get to your appointment.  2) [place] - To see if you qualify for "orange card" access to healthcare safety net providers.  Call for appointment for eligibility/enrollment at [phone] or [phone]. (Uninsured, 0-200% FPL, qualifying info)  Applicants for [facility] are first required to see if they are eligible to enroll in the [facility] Marketplace before enrolling in [facility] (and get an exemption if they are not).  GCCN Criteria for acceptance is:    Proof of  ACA Marketing exemption - form or documentation    Valid photo ID (driver's license, state identification card, passport, home country ID)    Proof of [facility] residency (e.g. driver's license, lease/landlord information, pay stubs with address, utility bill, bank statement, etc.)    Proof of income (1040, last year's tax return, W2, 4 current pay stubs, other income proof)    Proof of assets (current bank statement + 3 most recent, disability paperwork, life  insurance info, tax value on autos, etc.)  3) [place] Department  Not all health departments have doctors that can see patients for sick visits, but many do, so it is worth a call to see if yours does. If you don't know where your local health department is, you can check in your phone book. The CDC also has a tool to help you locate your state's health department, and many state websites also have listings of all of their local health departments.  4) Find a [facility]  If your illness is not likely to be very severe or complicated, you may want to try a walk in clinic. These are popping up all over the country in pharmacies, drugstores, and shopping centers. They're usually staffed by nurse practitioners or physician assistants that have been trained to treat common illnesses and complaints. They're usually fairly quick and inexpensive. However, if you have serious medical issues or chronic medical problems, these are probably not your best option   STD Testing:           [place], [place] [facility]           [address] [place], phone [phone] or [phone]           Monday - Friday, call for an appointment          [place], [place] [facility]           [address], [place], phone [phone] or [phone]           Monday - Friday, call for an appointment Abuse/Neglect:           [place]: [place]: [phone] (After Hours)  Emergency Shelter:  [facility] [phone]  [place]- [phone]  [place] - [phone]  Youth Focus - Act Together - [phone] (ages 72-17)  [place] @ [employer] - [phone]   Mammograms - Free at [facility] [place] [facility]:           Room at the [place]: [phone]   (Homeless mother with children)          [place]: [phone] (Mothers only)   Youth Focus: [phone] (Pregnant 26-6 years old)   Adopt a Mom [phone]  [facility]    [facility] and [place]   [address], [place] [phone]          [facility] [place]           [address] [place] [place]          [employer]  [place]          [facility] Dept.           [place], [place]          [facility]           [phone]          [facility] in [place]           [phone], [place]           [phone]           [phone] (After Hours)  [place] Abuse Resources:           Alcohol and Drug Services: [phone]           [facility]: [phone]          [facility]: [phone]    Narcotics Helpline - [phone]          [facility]: [phone]           Residential & Outpatient Substance Abuse Program - [facility] [place]: [phone]   [employer]  [place] and [place] - [phone] Psychological Services:          [place]: [phone]    Therapeutic Alternatives: [phone]          [place]           [address] [place]: [phone]     (24 Hour)   Mobile Crisis:    [place]:  Radio producer on [place] [phone] [facility] on [place] [phone]   Walk In [place]  ([place] - [phone] or [phone]  [place]. [place] [phone]  [place] [address] [place] [phone]   Dental Assistance:  If unable to pay or uninsured, contact: [facility]. to become qualified for the adult dental clinic. Patient must be enrolled in [facility] (uninsured, 0-200% FPL, qualifying info).  Enroll in [facility] first, then see Primary Care Physician assigned to you, the PCP makes a dental referral. [place] [facility] Access Program will receive referral and contacts patient for appointment.  Patients with Medicaid           [address], [place] (Children up to 75 + Pregnant Women) - [phone]  [place] - Suite [phone]  If unable to pay, or uninsured: contact [place] [phone] in [place] - ([place] only + Pregnant Women), [phone] in [place] only) to become qualified for the adult dental clinic  Must see  if eligible to enroll in [place] before enrolling into the [facility] (exemption required) [phone] for an appointment)  [email or web address];   [phone].  If not eligible for ACA, then go by Department of Health and Human Services to see if eligible for "orange card."  [address], [place] and [place].  Once you get an orange card, you will have a Primary Care home who will then refer you to dental if needed.        Other Scientist, forensic:   [facility] [phone] (ext [phone])   [address]  Dr. Donn Pierini - [phone]   [place]   [address] [facility]           [place], [place], [place], [address]           [phone], Ext. [phone]            2nd and 4th Thursday of the month at 6:30am (Simple extractions only - no wisdom teeth or surgery) First come/First serve -First 10 clients served           [facility] [place], [place] and [place] residents only)          [address] Madelaine Bhat [place], [place], [address]           [phone]                    [place] Department           [phone]          [place] Department          [phone]         [facility]          [phone]   Transportation Options:  Ambulance - 911 - [phone] per ride Family Member to accompany patient (if stable) - [place] - [phone]  PART - [phone]  Taxi - [phone] - [place] [phone] (Application required)  [facility] - [phone]

## 2013-11-16 NOTE — Progress Notes (Signed)
Doing well.  Good fetal movement, denies vaginal bleeding, LOF, regular contractions.  Denies urinary symptoms.  She reports occasional episodes of palpitations continue, have occurred for 17 years without worsening.  Discussed water for labor support, use of shower, bathtub.  Reviewed signs of labor/reasons to come to hospital.    Answered pt questions about BTL, anesthesia, plans to remove dermoid cyst PP.  Consult Dr Roselie Awkward.  Pt interested in BTL after delivery, then follow dermoid and cystectomy if needed later.    Pt concerned about insurance, has had trouble qualifying for coverage outside of pregnancy.  She needs to see a dentist and follow up with cardiology. She did not go to [facility] cardi[facility] appt because she is concerned about insurance coverage now with pregnancy Medicaid and if she needs ongoing care.  Pt given resources for primary care options with and without Medicaid.  Discussed Orange Card option with pt.  Cardiology appt rescheduled for pt.

## 2013-11-19 ENCOUNTER — Telehealth: Payer: Self-pay | Admitting: *Deleted

## 2013-11-19 DIAGNOSIS — I499 Cardiac arrhythmia, unspecified: Secondary | ICD-10-CM

## 2013-11-19 LAB — CULTURE, OB URINE

## 2013-11-19 NOTE — Telephone Encounter (Signed)
Cardiology already closed. Referral entered in computer. Will call back tomorrow.

## 2013-11-19 NOTE — Telephone Encounter (Signed)
Message copied by Mitchell Heir on Mon Nov 19, 2013  4:44 PM ------      Message from: Fatima Blank A      Created: Fri Nov 16, 2013 12:11 PM      Regarding: Pt needs Cardiology appointment      Contact: [phone]       Pt is 38 weeks, has hx palpitations x17 years. Cardiology referral and appt made but pt insurance was pending so she did not go. She needs new cardiology appt with [place].  Thank you.   ------

## 2013-11-20 NOTE — Telephone Encounter (Signed)
Made cardiology appt for 5/27 @11 . Called patient and informed her of appt date and time as well as location and phone number for [facility] at [place] patient that if the appt doesn't work for her as the time gets closer to call them directly and reschedule. Patient verbalized understanding to all and had no further questions

## 2013-11-21 ENCOUNTER — Ambulatory Visit (INDEPENDENT_AMBULATORY_CARE_PROVIDER_SITE_OTHER): Payer: Medicaid Other | Admitting: Advanced Practice Midwife

## 2013-11-21 ENCOUNTER — Other Ambulatory Visit: Payer: Self-pay | Admitting: Advanced Practice Midwife

## 2013-11-21 VITALS — BP 123/71 | HR 83 | Wt 205.1 lb

## 2013-11-21 DIAGNOSIS — O99419 Diseases of the circulatory system complicating pregnancy, unspecified trimester: Secondary | ICD-10-CM

## 2013-11-21 DIAGNOSIS — I2510 Atherosclerotic heart disease of native coronary artery without angina pectoris: Secondary | ICD-10-CM

## 2013-11-21 DIAGNOSIS — O0993 Supervision of high risk pregnancy, unspecified, third trimester: Secondary | ICD-10-CM

## 2013-11-21 LAB — POCT URINALYSIS DIP (DEVICE)
GLUCOSE, UA: NEGATIVE mg/dL
Ketones, ur: NEGATIVE mg/dL
NITRITE: NEGATIVE
PROTEIN: 30 mg/dL — AB
Specific Gravity, Urine: >=1.03 (ref 1.005–1.030)
UROBILINOGEN UA: 1 mg/dL (ref 0.0–1.0)
pH: 6 (ref 5.0–8.0)

## 2013-11-21 MED ORDER — AMPICILLIN 500 MG PO CAPS: 500.0000 mg | ORAL_CAPSULE | Freq: Four times a day (QID) | ORAL | Status: DC

## 2013-11-21 NOTE — Progress Notes (Signed)
Doing well, no c/o. Wants BTL postpartum. Has cardiology appt late may. Will get Dermoid removed after delivery when uterus is smaller.

## 2013-11-21 NOTE — Progress Notes (Signed)
Pt is unaware of ampicillin medication on patient's list prescribed today.

## 2013-11-21 NOTE — Patient Instructions (Signed)
Third Trimester of Pregnancy The third trimester is from week 29 through week 42, months 7 through 9. The third trimester is a time when the fetus is growing rapidly. At the end of the ninth month, the fetus is about 20 inches in length and weighs 6 10 pounds.  BODY CHANGES Your body goes through many changes during pregnancy. The changes vary from woman to woman.   Your weight will continue to increase. You can expect to gain 25 35 pounds (11 16 kg) by the end of the pregnancy.  You may begin to get stretch marks on your hips, abdomen, and breasts.  You may urinate more often because the fetus is moving lower into your pelvis and pressing on your bladder.  You may develop or continue to have heartburn as a result of your pregnancy.  You may develop constipation because certain hormones are causing the muscles that push waste through your intestines to slow down.  You may develop hemorrhoids or swollen, bulging veins (varicose veins).  You may have pelvic pain because of the weight gain and pregnancy hormones relaxing your joints between the bones in your pelvis. Back aches may result from over exertion of the muscles supporting your posture.  Your breasts will continue to grow and be tender. A yellow discharge may leak from your breasts called colostrum.  Your belly button may stick out.  You may feel short of breath because of your expanding uterus.  You may notice the fetus "dropping," or moving lower in your abdomen.  You may have a bloody mucus discharge. This usually occurs a few days to a week before labor begins.  Your cervix becomes thin and soft (effaced) near your due date. WHAT TO EXPECT AT YOUR PRENATAL EXAMS  You will have prenatal exams every 2 weeks until week 36. Then, you will have weekly prenatal exams. During a routine prenatal visit:  You will be weighed to make sure you and the fetus are growing normally.  Your blood pressure is taken.  Your abdomen will be  measured to track your baby's growth.  The fetal heartbeat will be listened to.  Any test results from the previous visit will be discussed.  You may have a cervical check near your due date to see if you have effaced. At around 36 weeks, your caregiver will check your cervix. At the same time, your caregiver will also perform a test on the secretions of the vaginal tissue. This test is to determine if a type of bacteria, Group B streptococcus, is present. Your caregiver will explain this further. Your caregiver may ask you:  What your birth plan is.  How you are feeling.  If you are feeling the baby move.  If you have had any abnormal symptoms, such as leaking fluid, bleeding, severe headaches, or abdominal cramping.  If you have any questions. Other tests or screenings that may be performed during your third trimester include:  Blood tests that check for low iron levels (anemia).  Fetal testing to check the health, activity level, and growth of the fetus. Testing is done if you have certain medical conditions or if there are problems during the pregnancy. FALSE LABOR You may feel small, irregular contractions that eventually go away. These are called Braxton Hicks contractions, or false labor. Contractions may last for hours, days, or even weeks before true labor sets in. If contractions come at regular intervals, intensify, or become painful, it is best to be seen by your caregiver.  SIGNS OF LABOR   Menstrual-like cramps.  Contractions that are 5 minutes apart or less.  Contractions that start on the top of the uterus and spread down to the lower abdomen and back.  A sense of increased pelvic pressure or back pain.  A watery or bloody mucus discharge that comes from the vagina. If you have any of these signs before the 37th week of pregnancy, call your caregiver right away. You need to go to the hospital to get checked immediately. HOME CARE INSTRUCTIONS   Avoid all  smoking, herbs, alcohol, and unprescribed drugs. These chemicals affect the formation and growth of the baby.  Follow your caregiver's instructions regarding medicine use. There are medicines that are either safe or unsafe to take during pregnancy.  Exercise only as directed by your caregiver. Experiencing uterine cramps is a good sign to stop exercising.  Continue to eat regular, healthy meals.  Wear a good support bra for breast tenderness.  Do not use hot tubs, steam rooms, or saunas.  Wear your seat belt at all times when driving.  Avoid raw meat, uncooked cheese, cat litter boxes, and soil used by cats. These carry germs that can cause birth defects in the baby.  Take your prenatal vitamins.  Try taking a stool softener (if your caregiver approves) if you develop constipation. Eat more high-fiber foods, such as fresh vegetables or fruit and whole grains. Drink plenty of fluids to keep your urine clear or pale yellow.  Take warm sitz baths to soothe any pain or discomfort caused by hemorrhoids. Use hemorrhoid cream if your caregiver approves.  If you develop varicose veins, wear support hose. Elevate your feet for 15 minutes, 3 4 times a day. Limit salt in your diet.  Avoid heavy lifting, wear low heal shoes, and practice good posture.  Rest a lot with your legs elevated if you have leg cramps or low back pain.  Visit your dentist if you have not gone during your pregnancy. Use a soft toothbrush to brush your teeth and be gentle when you floss.  A sexual relationship may be continued unless your caregiver directs you otherwise.  Do not travel far distances unless it is absolutely necessary and only with the approval of your caregiver.  Take prenatal classes to understand, practice, and ask questions about the labor and delivery.  Make a trial run to the hospital.  Pack your hospital bag.  Prepare the baby's nursery.  Continue to go to all your prenatal visits as directed  by your caregiver. SEEK MEDICAL CARE IF:  You are unsure if you are in labor or if your water has broken.  You have dizziness.  You have mild pelvic cramps, pelvic pressure, or nagging pain in your abdominal area.  You have persistent nausea, vomiting, or diarrhea.  You have a bad smelling vaginal discharge.  You have pain with urination. SEEK IMMEDIATE MEDICAL CARE IF:   You have a fever.  You are leaking fluid from your vagina.  You have spotting or bleeding from your vagina.  You have severe abdominal cramping or pain.  You have rapid weight loss or gain.  You have shortness of breath with chest pain.  You notice sudden or extreme swelling of your face, hands, ankles, feet, or legs.  You have not felt your baby move in over an hour.  You have severe headaches that do not go away with medicine.  You have vision changes. Document Released: 06/29/2001 Document Revised: 03/07/2013 Document Reviewed:   You have severe abdominal cramping or pain.   You have rapid weight loss or gain.   You have shortness of breath with chest pain.   You notice sudden or extreme swelling of your face, hands, ankles, feet, or legs.   You have not felt your baby move in over an hour.   You have severe headaches that do not go away with medicine.   You have vision changes.  Document Released: 06/29/2001 Document Revised: 03/07/2013 Document Reviewed: 09/05/2012  ExitCare Patient Information 2014 [employer].

## 2013-11-21 NOTE — Progress Notes (Signed)
Pt positive for UTI in urine culture 11/16/13.  Rx sent to pt pharmacy.

## 2013-11-29 ENCOUNTER — Inpatient Hospital Stay (HOSPITAL_COMMUNITY)
Admission: AD | Admit: 2013-11-29 | Discharge: 2013-11-30 | Disposition: A | Discharge status: Home / Self Care | Payer: Medicaid Other | Source: Ambulatory Visit | Attending: Obstetrics and Gynecology | Admitting: Obstetrics and Gynecology

## 2013-11-29 DIAGNOSIS — O2340 Unspecified infection of urinary tract in pregnancy, unspecified trimester: Secondary | ICD-10-CM

## 2013-11-29 DIAGNOSIS — O99330 Smoking (tobacco) complicating pregnancy, unspecified trimester: Secondary | ICD-10-CM | POA: Insufficient documentation

## 2013-11-29 DIAGNOSIS — B951 Streptococcus, group B, as the cause of diseases classified elsewhere: Secondary | ICD-10-CM

## 2013-11-29 DIAGNOSIS — R002 Palpitations: Secondary | ICD-10-CM | POA: Insufficient documentation

## 2013-11-29 DIAGNOSIS — O1203 Gestational edema, third trimester: Secondary | ICD-10-CM

## 2013-11-29 DIAGNOSIS — R262 Difficulty in walking, not elsewhere classified: Secondary | ICD-10-CM | POA: Insufficient documentation

## 2013-11-29 DIAGNOSIS — IMO0002 Reserved for concepts with insufficient information to code with codable children: Secondary | ICD-10-CM | POA: Insufficient documentation

## 2013-11-30 ENCOUNTER — Encounter (HOSPITAL_COMMUNITY): Payer: Self-pay | Admitting: *Deleted

## 2013-11-30 DIAGNOSIS — N390 Urinary tract infection, site not specified: Secondary | ICD-10-CM

## 2013-11-30 DIAGNOSIS — O2390 Unspecified genitourinary tract infection in pregnancy, unspecified trimester: Secondary | ICD-10-CM

## 2013-11-30 DIAGNOSIS — B951 Streptococcus, group B, as the cause of diseases classified elsewhere: Secondary | ICD-10-CM

## 2013-11-30 NOTE — MAU Note (Signed)
PT SAYS SHE GETS PNC- DOWNSTAIRS-     SAYS FEET STARTED SWELLING- LAST WEEK-  BUT  THIS WEEK-   SWELLING DOESN'T  GO DOWN.      SAYS  URINE - NEG IN CLINIC    VE IN CLINIC- 2 WEEKS  AGO  2 CM.    GBS-  POSITIVE.     NO H/A

## 2013-11-30 NOTE — Progress Notes (Signed)
    Chief Complaint: Swelling  Allison Drake is  33 y.o. [id number] at [redacted]w[redacted]d presents complaining of BLE swelling.  She has tried propping her feet up and it helps a little .   Obstetrical/Gynecological History: OB History   Grav Para Term Preterm Abortions TAB SAB Ect Mult Living   4 2 2  0 1 1 0 0 0 2     Past Medical History: Past Medical History  Diagnosis Date  . Palpitations   . Vaginal Pap smear, abnormal     LEEP    Past Surgical History: Past Surgical History  Procedure Laterality Date  . Induced abortion    . Leep      Family History: Family History  Problem Relation Age of Onset  . Cancer Maternal Uncle   . Miscarriages / Stillbirths Maternal Grandmother   . Heart disease Maternal Grandmother   . Miscarriages / Stillbirths Maternal Grandfather   . Heart disease Maternal Grandfather   . Heart disease Paternal Grandfather   . Miscarriages / Stillbirths Paternal Grandfather     Social History: History  Substance Use Topics  . Smoking status: Current Every Day Smoker -- 0.50 packs/day for 14 years    Types: Cigarettes  . Smokeless tobacco: Never Used  . Alcohol Use: No    Allergies: No Known Allergies  Meds:  No prescriptions prior to admission   Review of Systems   Constitutional: Negative for fever and chills Eyes: Negative for visual disturbances Respiratory: Negative for shortness of breath, dyspnea Cardiovascular: Negative for chest pain or palpitations  Gastrointestinal: Negative for vomiting, diarrhea and constipation Genitourinary: Negative for dysuria and urgency Musculoskeletal: Negative for back pain, joint pain, myalgias  Neurological: Negative for dizziness and headaches     Physical Exam  Blood pressure 127/77, pulse 78, temperature 97.9 F (36.6 C), temperature source Oral, resp. rate 16, height 5\' 8"  (1.727 m), weight 99.848 kg (220 lb 2 oz), last menstrual period 01/31/2013. GENERAL: Well-developed, well-nourished female in  no acute distress.  LUNGS: Clear to auscultation bilaterally.  HEART: Regular rate and rhythm. ABDOMEN: Soft, nontender, nondistended, gravid.  EXTREMITIES: Nontender, no edema, 2+ distal pulses. DTR's 3+ FHT:  Baseline rate 140 bpm   Variability moderate  Accelerations present   Decelerations none Contractions: Every 0 mins   Labs: No results found for this or any previous visit (from the past 24 hour(s)). Imaging Studies:  No results found.  Assessment: Allison Drake is  33 y.o. [phone] at [redacted]w[redacted]d presents with BLE.  Plan: Discussed measures to reduce swelling  Christin Fudge 5/15/20156:56 AM

## 2013-11-30 NOTE — Progress Notes (Signed)
Pt having difficulty walking

## 2013-11-30 NOTE — MAU Note (Signed)
Pt states she has had swelling her feet all week. Pt has discomfort walking and can not wear shoes

## 2013-11-30 NOTE — Discharge Instructions (Signed)
Edema Edema is an abnormal build-up of fluids in tissues. Because this is partly dependent on gravity (water flows to the lowest place), it is more common in the legs and thighs (lower extremities). It is also common in the looser tissues, like around the eyes. Painless swelling of the feet and ankles is common and increases as a person ages. It may affect both legs and may include the calves or even thighs. When squeezed, the fluid may move out of the affected area and may leave a dent for a few moments. CAUSES   Prolonged standing or sitting in one place for extended periods of time. Movement helps pump tissue fluid into the veins, and absence of movement prevents this, resulting in edema.  Varicose veins. The valves in the veins do not work as well as they should. This causes fluid to leak into the tissues.  Fluid and salt overload.  Injury, burn, or surgery to the leg, ankle, or foot, may damage veins and allow fluid to leak out.  Sunburn damages vessels. Leaky vessels allow fluid to go out into the sunburned tissues.  Allergies (from insect bites or stings, medications or chemicals) cause swelling by allowing vessels to become leaky.  Protein in the blood helps keep fluid in your vessels. Low protein, as in malnutrition, allows fluid to leak out.  Hormonal changes, including pregnancy and menstruation, cause fluid retention. This fluid may leak out of vessels and cause edema.  Medications that cause fluid retention. Examples are sex hormones, blood pressure medications, steroid treatment, or anti-depressants.  Some illnesses cause edema, especially heart failure, kidney disease, or liver disease.  Surgery that cuts veins or lymph nodes, such as surgery done for the heart or for breast cancer, may result in edema. DIAGNOSIS  Your caregiver is usually easily able to determine what is causing your swelling (edema) by simply asking what is wrong (getting a history) and examining you (doing  a physical). Sometimes x-rays, EKG (electrocardiogram or heart tracing), and blood work may be done to evaluate for underlying medical illness. TREATMENT  General treatment includes:  Leg elevation (or elevation of the affected body part).  Restriction of fluid intake.  Prevention of fluid overload.  Compression of the affected body part. Compression with elastic bandages or support stockings squeezes the tissues, preventing fluid from entering and forcing it back into the blood vessels.  Diuretics (also called water pills or fluid pills) pull fluid out of your body in the form of increased urination. These are effective in reducing the swelling, but can have side effects and must be used only under your caregiver's supervision. Diuretics are appropriate only for some types of edema. The specific treatment can be directed at any underlying causes discovered. Heart, liver, or kidney disease should be treated appropriately. HOME CARE INSTRUCTIONS   Elevate the legs (or affected body part) above the level of the heart, while lying down.  Avoid sitting or standing still for prolonged periods of time.  Avoid putting anything directly under the knees when lying down, and do not wear constricting clothing or garters on the upper legs.  Exercising the legs causes the fluid to work back into the veins and lymphatic channels. This may help the swelling go down.  The pressure applied by elastic bandages or support stockings can help reduce ankle swelling.  A low-salt diet may help reduce fluid retention and decrease the ankle swelling.  Take any medications exactly as prescribed. SEEK MEDICAL CARE IF:  Your edema is   not responding to recommended treatments. SEEK IMMEDIATE MEDICAL CARE IF:   You develop shortness of breath or chest pain.  You cannot breathe when you lay down; or if, while lying down, you have to get up and go to the window to get your breath.  You are having increasing  swelling without relief from treatment.  You develop a fever over 102 F (38.9 C).  You develop pain or redness in the areas that are swollen.  Tell your caregiver right away if you have gained 03 lb/1.4 kg in 1 day or 05 lb/2.3 kg in a week. MAKE SURE YOU:   Understand these instructions.  Will watch your condition.  Will get help right away if you are not doing well or get worse. Document Released: 07/05/2005 Document Revised: 01/04/2012 Document Reviewed: 02/21/2008 [employer] Patient Information 2014 [employer].  

## 2013-12-01 ENCOUNTER — Encounter: Payer: Self-pay | Admitting: *Deleted

## 2013-12-04 ENCOUNTER — Inpatient Hospital Stay (HOSPITAL_COMMUNITY)
Admission: AD | Admit: 2013-12-04 | Discharge: 2013-12-06 | DRG: 775 | Disposition: A | Discharge status: Home / Self Care | Payer: Medicaid Other | Source: Ambulatory Visit | Attending: Family Medicine | Admitting: Family Medicine

## 2013-12-04 ENCOUNTER — Encounter (HOSPITAL_COMMUNITY): Payer: Self-pay | Admitting: *Deleted

## 2013-12-04 DIAGNOSIS — B951 Streptococcus, group B, as the cause of diseases classified elsewhere: Secondary | ICD-10-CM

## 2013-12-04 DIAGNOSIS — O0930 Supervision of pregnancy with insufficient antenatal care, unspecified trimester: Secondary | ICD-10-CM

## 2013-12-04 DIAGNOSIS — IMO0001 Reserved for inherently not codable concepts without codable children: Secondary | ICD-10-CM

## 2013-12-04 DIAGNOSIS — O09299 Supervision of pregnancy with other poor reproductive or obstetric history, unspecified trimester: Secondary | ICD-10-CM

## 2013-12-04 DIAGNOSIS — O2340 Unspecified infection of urinary tract in pregnancy, unspecified trimester: Secondary | ICD-10-CM

## 2013-12-04 DIAGNOSIS — IMO0002 Reserved for concepts with insufficient information to code with codable children: Principal | ICD-10-CM | POA: Diagnosis not present

## 2013-12-04 DIAGNOSIS — Z22330 Carrier of Group B streptococcus: Secondary | ICD-10-CM

## 2013-12-04 DIAGNOSIS — O99892 Other specified diseases and conditions complicating childbirth: Secondary | ICD-10-CM | POA: Diagnosis present

## 2013-12-04 DIAGNOSIS — R002 Palpitations: Secondary | ICD-10-CM | POA: Diagnosis present

## 2013-12-04 DIAGNOSIS — O99334 Smoking (tobacco) complicating childbirth: Secondary | ICD-10-CM | POA: Diagnosis present

## 2013-12-04 DIAGNOSIS — O34599 Maternal care for other abnormalities of gravid uterus, unspecified trimester: Secondary | ICD-10-CM | POA: Diagnosis present

## 2013-12-04 DIAGNOSIS — Z8249 Family history of ischemic heart disease and other diseases of the circulatory system: Secondary | ICD-10-CM

## 2013-12-04 DIAGNOSIS — O480 Post-term pregnancy: Secondary | ICD-10-CM

## 2013-12-04 DIAGNOSIS — D279 Benign neoplasm of unspecified ovary: Secondary | ICD-10-CM | POA: Diagnosis present

## 2013-12-04 DIAGNOSIS — N83209 Unspecified ovarian cyst, unspecified side: Secondary | ICD-10-CM

## 2013-12-04 DIAGNOSIS — O9989 Other specified diseases and conditions complicating pregnancy, childbirth and the puerperium: Secondary | ICD-10-CM

## 2013-12-04 LAB — CBC
HEMATOCRIT: 34.5 % — AB (ref 36.0–46.0)
HEMOGLOBIN: 11.7 g/dL — AB (ref 12.0–15.0)
MCH: 27.1 pg (ref 26.0–34.0)
MCHC: 33.9 g/dL (ref 30.0–36.0)
MCV: 79.9 fL (ref 78.0–100.0)
Platelets: 200 10*3/uL (ref 150–400)
RBC: 4.32 MIL/uL (ref 3.87–5.11)
RDW: 14.4 % (ref 11.5–15.5)
WBC: 13.8 10*3/uL — AB (ref 4.0–10.5)

## 2013-12-04 LAB — RPR

## 2013-12-04 MED ORDER — OXYTOCIN 40 UNITS IN LACTATED RINGERS INFUSION - SIMPLE MED: 62.5000 mL/h | INTRAVENOUS | Status: DC

## 2013-12-04 MED ORDER — SENNOSIDES-DOCUSATE SODIUM 8.6-50 MG PO TABS
2.0000 | ORAL_TABLET | ORAL | Status: DC
  Administered 2013-12-05 (×2): 2 via ORAL
  Filled 2013-12-04 (×2): qty 2

## 2013-12-04 MED ORDER — ACETAMINOPHEN 325 MG PO TABS: 650.0000 mg | ORAL_TABLET | ORAL | Status: DC | PRN

## 2013-12-04 MED ORDER — SIMETHICONE 80 MG PO CHEW: 80.0000 mg | CHEWABLE_TABLET | ORAL | Status: DC | PRN

## 2013-12-04 MED ORDER — DIBUCAINE 1 % RE OINT: 1.0000 "application " | TOPICAL_OINTMENT | RECTAL | Status: DC | PRN

## 2013-12-04 MED ORDER — WITCH HAZEL-GLYCERIN EX PADS
1.0000 "application " | MEDICATED_PAD | CUTANEOUS | Status: DC | PRN
  Administered 2013-12-04: 1 via TOPICAL

## 2013-12-04 MED ORDER — CITRIC ACID-SODIUM CITRATE 334-500 MG/5ML PO SOLN: 30.0000 mL | ORAL | Status: DC | PRN

## 2013-12-04 MED ORDER — ONDANSETRON HCL 4 MG PO TABS: 4.0000 mg | ORAL_TABLET | ORAL | Status: DC | PRN

## 2013-12-04 MED ORDER — ONDANSETRON HCL 4 MG/2ML IJ SOLN: 4.0000 mg | INTRAMUSCULAR | Status: DC | PRN

## 2013-12-04 MED ORDER — DIPHENHYDRAMINE HCL 25 MG PO CAPS: 25.0000 mg | ORAL_CAPSULE | Freq: Four times a day (QID) | ORAL | Status: DC | PRN

## 2013-12-04 MED ORDER — FLEET ENEMA 7-19 GM/118ML RE ENEM: 1.0000 | ENEMA | RECTAL | Status: DC | PRN

## 2013-12-04 MED ORDER — LIDOCAINE HCL (PF) 1 % IJ SOLN
30.0000 mL | INTRAMUSCULAR | Status: DC | PRN
  Filled 2013-12-04: qty 30

## 2013-12-04 MED ORDER — BENZOCAINE-MENTHOL 20-0.5 % EX AERO
1.0000 "application " | INHALATION_SPRAY | CUTANEOUS | Status: DC | PRN
  Administered 2013-12-04 (×2): 1 via TOPICAL
  Filled 2013-12-04: qty 56

## 2013-12-04 MED ORDER — ZOLPIDEM TARTRATE 5 MG PO TABS: 5.0000 mg | ORAL_TABLET | Freq: Every evening | ORAL | Status: DC | PRN

## 2013-12-04 MED ORDER — ONDANSETRON HCL 4 MG/2ML IJ SOLN: 4.0000 mg | Freq: Four times a day (QID) | INTRAMUSCULAR | Status: DC | PRN

## 2013-12-04 MED ORDER — PRENATAL MULTIVITAMIN CH
1.0000 | ORAL_TABLET | Freq: Every day | ORAL | Status: DC
  Administered 2013-12-04 – 2013-12-06 (×3): 1 via ORAL
  Filled 2013-12-04 (×3): qty 1

## 2013-12-04 MED ORDER — OXYTOCIN BOLUS FROM INFUSION
500.0000 mL | INTRAVENOUS | Status: DC
Start: 2013-12-04 — End: 2013-12-04

## 2013-12-04 MED ORDER — OXYTOCIN 10 UNIT/ML IJ SOLN
10.0000 [IU] | Freq: Once | INTRAMUSCULAR | Status: AC
  Administered 2013-12-04: 10 [IU] via INTRAMUSCULAR
  Filled 2013-12-04: qty 1

## 2013-12-04 MED ORDER — IBUPROFEN 600 MG PO TABS
600.0000 mg | ORAL_TABLET | Freq: Four times a day (QID) | ORAL | Status: DC
  Administered 2013-12-04 – 2013-12-06 (×9): 600 mg via ORAL
  Filled 2013-12-04 (×9): qty 1

## 2013-12-04 MED ORDER — LACTATED RINGERS IV SOLN: 500.0000 mL | INTRAVENOUS | Status: DC | PRN

## 2013-12-04 MED ORDER — OXYCODONE-ACETAMINOPHEN 5-325 MG PO TABS
1.0000 | ORAL_TABLET | ORAL | Status: DC | PRN
  Administered 2013-12-04: 1 via ORAL
  Filled 2013-12-04: qty 1

## 2013-12-04 MED ORDER — IBUPROFEN 600 MG PO TABS
600.0000 mg | ORAL_TABLET | Freq: Four times a day (QID) | ORAL | Status: DC | PRN
  Administered 2013-12-04: 600 mg via ORAL
  Filled 2013-12-04: qty 1

## 2013-12-04 MED ORDER — TETANUS-DIPHTH-ACELL PERTUSSIS 5-2.5-18.5 LF-MCG/0.5 IM SUSP: 0.5000 mL | Freq: Once | INTRAMUSCULAR | Status: DC

## 2013-12-04 MED ORDER — OXYCODONE-ACETAMINOPHEN 5-325 MG PO TABS
1.0000 | ORAL_TABLET | ORAL | Status: DC | PRN
  Administered 2013-12-04: 1 via ORAL
  Administered 2013-12-05 – 2013-12-06 (×3): 2 via ORAL
  Filled 2013-12-04 (×2): qty 2
  Filled 2013-12-04: qty 1
  Filled 2013-12-04 (×2): qty 2

## 2013-12-04 MED ORDER — LANOLIN HYDROUS EX OINT: TOPICAL_OINTMENT | CUTANEOUS | Status: DC | PRN

## 2013-12-04 NOTE — Lactation Note (Signed)
This note was copied from the chart of [place]. Lactation Consultation Note  Patient Name: Boy Jasmina Gendron [id number] Date: 12/04/2013 Reason for consult: Other (Comment) (charting for exclusion)   Maternal Data Formula Feeding for Exclusion: Yes Reason for exclusion: Mother's choice to formula feed on admision  Feeding    LATCH Score/Interventions                      Lactation Tools Discussed/Used     Consult Status Consult Status: Complete    Landis Gandy 12/04/2013, 4:15 PM

## 2013-12-04 NOTE — Progress Notes (Signed)
Pt arrived to room 166 via stretcher around 0912.  SROM on way to BS.  Dr Rosiland Oz called to come to room for delivery.  Pt delivered at 0914 on stretcher in room 166.  md in attendance.

## 2013-12-04 NOTE — H&P (Signed)
LABOR ADMISSION HISTORY AND PHYSICAL  Allison Drake is a 33 y.o. female [phone] with IUP at [redacted]w[redacted]d presenting for active labor. Allison Drake was a patient in [facility] with onset of care at 32 wks.  Pregnancy dated by 31 week ultrasound.  Pregnancy complicated by history of LEEP, late prenatal care, and right dermoid cyst measuring 2x3x2 cm.  Allison Drake desires BTL, consent signed.  Will obtain BTL in hospital and follow-up with possible cystectomy interval postpartum.    Allison Drake has a history of intermittent palpitations for past 17 years.  Did not see cardiologist during pregnancy as scheduled due to concern with being billed by insurance prior to activation of Medicaid.  Cardiology appt rescheduled for end of May.  .    Prenatal History/Complications:  Past Medical History: Past Medical History  Diagnosis Date  . Palpitations   . Vaginal Pap smear, abnormal     LEEP    Past Surgical History: Past Surgical History  Procedure Laterality Date  . Induced abortion    . Leep      Obstetrical History: OB History   Grav Para Term Preterm Abortions TAB SAB Ect Mult Living   4 2 2  0 1 1 0 0 0 2      Social History: History   Social History  . Marital Status: Single    Spouse Name: N/A    Number of Children: N/A  . Years of Education: N/A   Social History Main Topics  . Smoking status: Current Every Day Smoker -- 0.50 packs/day for 14 years    Types: Cigarettes  . Smokeless tobacco: Never Used  . Alcohol Use: No  . Drug Use: No  . Sexual Activity: Yes   Other Topics Concern  . None   Social History Narrative  . None    Family History: Family History  Problem Relation Age of Onset  . Cancer Maternal Uncle   . Miscarriages / Stillbirths Maternal Grandmother   . Heart disease Maternal Grandmother   . Miscarriages / Stillbirths Maternal Grandfather   . Heart disease Maternal Grandfather   . Heart disease Paternal Grandfather   . Miscarriages / Stillbirths Paternal  Grandfather     Allergies: No Known Allergies  Prescriptions prior to admission  Medication Sig Dispense Refill  . hydrocortisone-pramoxine (PROCTOFOAM HC) rectal foam Place 1 applicator rectally 2 (two) times daily.  10 g  0  . pantoprazole (PROTONIX) 40 MG tablet Take 1 tablet (40 mg total) by mouth daily.  30 tablet  1  . phenylephrine-shark liver oil-mineral oil-petrolatum (PREPARATION H) 0.25-3-14-71.9 % rectal ointment Place 1 application rectally 2 (two) times daily as needed for hemorrhoids.      . Prenatal Vit-Fe Fumarate-FA (PRENATAL MULTIVITAMIN) TABS tablet Take 1 tablet by mouth daily at 12 noon.       GYN history:  3 cm dermoid cyst on U/S 09/2013  Review of Systems   All systems reviewed and negative except as stated in HPI  Blood pressure 147/88, pulse 101, last menstrual period 01/31/2013. General appearance: alert, cooperative and mild distress Lungs: clear to auscultation bilaterally Heart: regular rate and rhythm Abdomen: soft, non-tender; bowel sounds normal Extremities: Homans sign is negative, no sign of DVT Presentation: cephalic FHR baseline: 643, contractions 2-3 min.  Dilation: 8 Effacement (%): 90 Station: -1 Exam by:: Lavonna Rua, RNC   Prenatal labs: ABO, Rh: O/POS/-- (03/26 0956) Antibody: NEG (03/26 0956) Rubella:   RPR: NON REAC (03/26 0956)  HBsAg: NEGATIVE (  03/26 0956)  HIV: NON REACTIVE (03/26 0956)  GBS: Positive (04/16 0000)  1 hr Glucola 89 Genetic screening  Too late  Anatomy US WNL   Prenatal Transfer Tool  Maternal Diabetes: No Genetic Screening: Normal Maternal Ultrasounds/Referrals: Normal Fetal Ultrasounds or other Referrals:  None Maternal Substance Abuse:  No Significant Maternal Medications:  Meds include: Protonix Significant Maternal Lab Results: Lab values include: Group B Strep positive     No results found for this or any previous visit (from the past 24 hour(s)).  Assessment: Allison Drake is a 33 y.o.  [id number] at [redacted]w[redacted]d here for SROM and active labor.    #Labor: SVD  #Pain: Fentanyl  #FWB: Category 1  #ID:  no ABX given prior to delivery of child due to precipitous delivery.   #MOF: formula  #MOC: requesting BTL  #Circ:  Yes   Rosemarie Ax 12/04/2013, 9:37 AM  I examined Allison Drake and agree with documentation above and resident plan of care. [place], CNM

## 2013-12-04 NOTE — Progress Notes (Signed)
UR completed 

## 2013-12-05 ENCOUNTER — Encounter (HOSPITAL_COMMUNITY)
Admission: AD | Disposition: A | Discharge status: Home / Self Care | Payer: Self-pay | Source: Ambulatory Visit | Attending: Family Medicine

## 2013-12-05 ENCOUNTER — Encounter: Payer: Medicaid Other | Admitting: Obstetrics and Gynecology

## 2013-12-05 LAB — SURGICAL PCR SCREEN
MRSA, PCR: NEGATIVE
Staphylococcus aureus: POSITIVE — AB

## 2013-12-05 SURGERY — LIGATION, FALLOPIAN TUBE, POSTPARTUM
Anesthesia: Regional | Site: Abdomen | Laterality: Bilateral

## 2013-12-05 MED ORDER — LACTATED RINGERS IV SOLN
INTRAVENOUS | Status: DC
  Administered 2013-12-05: 10:00:00 via INTRAVENOUS

## 2013-12-05 MED ORDER — METOCLOPRAMIDE HCL 10 MG PO TABS
10.0000 mg | ORAL_TABLET | Freq: Once | ORAL | Status: AC
  Administered 2013-12-05: 10 mg via ORAL
  Filled 2013-12-05: qty 1

## 2013-12-05 MED ORDER — FAMOTIDINE 20 MG PO TABS
40.0000 mg | ORAL_TABLET | Freq: Once | ORAL | Status: AC
  Administered 2013-12-05: 40 mg via ORAL
  Filled 2013-12-05: qty 2

## 2013-12-05 SURGICAL SUPPLY — 20 items
BLADE 11 SAFETY STRL DISP (BLADE) ×1 IMPLANT
CHLORAPREP W/TINT 26ML (MISCELLANEOUS) ×1 IMPLANT
CLOTH BEACON ORANGE TIMEOUT ST (SAFETY) ×1 IMPLANT
GLOVE BIO SURGEON STRL SZ 6.5 (GLOVE) ×1 IMPLANT
GLOVE BIO SURGEONS STRL SZ 6.5 (GLOVE)
GLOVE BIOGEL PI IND STRL 7.0 (GLOVE) ×1 IMPLANT
GLOVE BIOGEL PI INDICATOR 7.0 (GLOVE)
GOWN STRL REUS W/TWL LRG LVL3 (GOWN DISPOSABLE) ×2 IMPLANT
NDL HYPO 25X1 1.5 SAFETY (NEEDLE) ×1 IMPLANT
NEEDLE HYPO 25X1 1.5 SAFETY (NEEDLE) IMPLANT
NS IRRIG 1000ML POUR BTL (IV SOLUTION) ×1 IMPLANT
PACK ABDOMINAL MINOR (CUSTOM PROCEDURE TRAY) ×1 IMPLANT
SPONGE LAP 4X18 X RAY DECT (DISPOSABLE) IMPLANT
SUT VIC AB 0 CT1 27 (SUTURE)
SUT VIC AB 0 CT1 27XBRD ANBCTR (SUTURE) ×1 IMPLANT
SUT VICRYL 4-0 PS2 18IN ABS (SUTURE) ×1 IMPLANT
SYR CONTROL 10ML LL (SYRINGE) ×1 IMPLANT
TOWEL OR 17X24 6PK STRL BLUE (TOWEL DISPOSABLE) ×2 IMPLANT
TRAY FOLEY BAG SILVER LF 14FR (CATHETERS) ×1 IMPLANT
WATER STERILE IRR 1000ML POUR (IV SOLUTION) ×1 IMPLANT

## 2013-12-05 NOTE — Progress Notes (Signed)
Discussed plan for sterilization management of dermoid cyst and patient requests that she have laparoscopic cystectomy and BTL as OP postpartum. DMPA as bridge.  Woodroe Mode, MD 12/05/2013 10:01 AM

## 2013-12-05 NOTE — Progress Notes (Signed)
Post Partum Day 1 Subjective: no complaints, up ad lib, voiding and tolerating PO  Objective: Blood pressure 134/78, pulse 67, temperature 98 F (36.7 C), temperature source Oral, resp. rate 18, height 5\' 9"  (1.753 m), weight 99.791 kg (220 lb), last menstrual period 01/31/2013, SpO2 100.00%, unknown if currently breastfeeding.  Physical Exam:  General: alert, cooperative and no distress Lochia: appropriate Uterine Fundus: firm Incision: N/A DVT Evaluation: No evidence of DVT seen on physical exam. Negative Homan's sign. No cords or calf tenderness. Calf/Ankle edema is present.   Recent Labs  12/04/13 1018  HGB 11.7*  HCT 34.5*    Assessment/Plan: Plan for discharge tomorrow 1. Pt to OR today for BTL 2. Ankle edema improving -pt educated to elevate legs   LOS: 1 day   Allison Drake 12/05/2013, 7:51 AM

## 2013-12-05 NOTE — Progress Notes (Signed)
CSW met with MOB to complete assessment for [facility] at 33 weeks.  MOB was pleasant and welcoming of CSW's visit.  FOB was asleep on the couch and not involved in the conversation.  MOB states she was under a lot of stress when she became pregnant (unplanned) and did not realize she was pregnant until approximately 28 weeks.  She states she started [facility] as soon as she could get an appointment.  She explained the stress she was under was due to the death of her uncle to cancer happening just a year after the death of another family member to cancer.  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  She states she and FOB are in a relationship, live together, and began getting baby supplies as soon as they found out about the pregnancy.  She states they are both happy about the baby even though the pregnancy was unplanned and they felt overwhelmed with the news initially.  This is FOB's first child and MOB's third.  She has two sons at home, ages 10 and 68.  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  She reports that they have a good support system.  CSW explained hospital drug screen policy due to [facility] and MOB was understanding and not concerned.  Baby's UDS is negative and CSW will monitor MDS result.  CSW identifies no referral needs at this time or barriers to discharge when MOB and baby are medically ready.

## 2013-12-06 NOTE — Discharge Instructions (Signed)
You should receive a phone call from the doctor's office to set up an appointment to discuss an outpatient surgery. You will not need any birth control in the time until now and the office visit. Please call the doctor's office if you do not receive a phone call within a week.   Postpartum Care After Vaginal Delivery After you deliver your newborn (postpartum period), the usual stay in the hospital is 24 72 hours. If there were problems with your labor or delivery, or if you have other medical problems, you might be in the hospital longer.  While you are in the hospital, you will receive help and instructions on how to care for yourself and your newborn during the postpartum period.  While you are in the hospital:  Be sure to tell your nurses if you have pain or discomfort, as well as where you feel the pain and what makes the pain worse.  If you had an incision made near your vagina (episiotomy) or if you had some tearing during delivery, the nurses may put ice packs on your episiotomy or tear. The ice packs may help to reduce the pain and swelling.  If you are breastfeeding, you may feel uncomfortable contractions of your uterus for a couple of weeks. This is normal. The contractions help your uterus get back to normal size.  It is normal to have some bleeding after delivery.  For the first 1 3 days after delivery, the flow is red and the amount may be similar to a period.  It is common for the flow to start and stop.  In the first few days, you may pass some small clots. Let your nurses know if you begin to pass large clots or your flow increases.  Do not  flush blood clots down the toilet before having the nurse look at them.  During the next 3 10 days after delivery, your flow should become more watery and pink or Schmieg-tinged in color.  Ten to fourteen days after delivery, your flow should be a small amount of yellowish-white discharge.  The amount of your flow will decrease over the  first few weeks after delivery. Your flow may stop in 6 8 weeks. Most women have had their flow stop by 12 weeks after delivery.  You should change your sanitary pads frequently.  Wash your hands thoroughly with soap and water for at least 20 seconds after changing pads, using the toilet, or before holding or feeding your newborn.  You should feel like you need to empty your bladder within the first 6 8 hours after delivery.  In case you become weak, lightheaded, or faint, call your nurse before you get out of bed for the first time and before you take a shower for the first time.  Within the first few days after delivery, your breasts may begin to feel tender and full. This is called engorgement. Breast tenderness usually goes away within 48 72 hours after engorgement occurs. You may also notice milk leaking from your breasts. If you are not breastfeeding, do not stimulate your breasts. Breast stimulation can make your breasts produce more milk.  Spending as much time as possible with your newborn is very important. During this time, you and your newborn can feel close and get to know each other. Having your newborn stay in your room (rooming in) will help to strengthen the bond with your newborn. It will give you time to get to know your newborn and become comfortable  caring for your newborn.  Your hormones change after delivery. Sometimes the hormone changes can temporarily cause you to feel sad or tearful. These feelings should not last more than a few days. If these feelings last longer than that, you should talk to your caregiver.  If desired, talk to your caregiver about methods of family planning or contraception.  Talk to your caregiver about immunizations. Your caregiver may want you to have the following immunizations before leaving the hospital:  Tetanus, diphtheria, and pertussis (Tdap) or tetanus and diphtheria (Td) immunization. It is very important that you and your family  (including grandparents) or others caring for your newborn are up-to-date with the Tdap or Td immunizations. The Tdap or Td immunization can help protect your newborn from getting ill.  Rubella immunization.  Varicella (chickenpox) immunization.  Influenza immunization. You should receive this annual immunization if you did not receive the immunization during your pregnancy. Document Released: 05/02/2007 Document Revised: 03/29/2012 Document Reviewed: 03/01/2012 [facility] Patient Information 2014 [employer].

## 2013-12-06 NOTE — Discharge Summary (Signed)
Obstetric Discharge Summary Reason for Admission: onset of labor Prenatal Procedures: ultrasound Intrapartum Procedures: spontaneous vaginal delivery Postpartum Procedures: none Complications-Operative and Postpartum: none Hemoglobin  Date Value Ref Range Status  12/04/2013 11.7* 12.0 - 15.0 g/dL Final     HCT  Date Value Ref Range Status  12/04/2013 34.5* 36.0 - 46.0 % Final   Allison Drake is a 33 y.o. female [phone] with IUP at [redacted]w[redacted]d presenting for active labor. Allison Drake was a patient in [place] clinic with onset of care at 32 wks. Pregnancy dated by 31 week ultrasound. Pregnancy complicated by history of LEEP, late prenatal care, and right dermoid cyst measuring 2x3x2 cm. At 9:14 AM (5/19) a viable female was delivered via Vaginal, Spontaneous Delivery (Presentation: Occiput Anterior). APGAR: 9, 9; weight.  Placenta status: Intact, Spontaneous. Cord: 3 vessels with no complications. Mother is GBS positive but delivered prior to administration of antibiotics. She will receive a laparoscopic cystectomy and BTL as OP postpartum. DMPA as bridge.  Physical Exam:  General: alert, cooperative and no distress Lochia: appropriate Uterine Fundus: firm Incision: n/a DVT Evaluation: No evidence of DVT seen on physical exam.  Discharge Diagnoses: Post-date pregnancy  Discharge Information: Date: 12/06/2013 Activity: unrestricted Diet: routine Medications: None Condition: stable Instructions: refer to practice specific booklet Discharge to: home   Newborn Data: Live born female  Birth Weight: 9 lb 4 oz (4196 g) APGAR: 9, 9  Home with mother.  Allison Drake 12/06/2013, 7:31 AM  I have seen and examined this patient and I agree with the above. Allison Drake CNM 9:10 AM 12/13/2013

## 2013-12-12 ENCOUNTER — Ambulatory Visit: Payer: Medicaid Other | Admitting: Cardiovascular Disease

## 2013-12-19 ENCOUNTER — Other Ambulatory Visit: Payer: Self-pay | Admitting: Family Medicine

## 2013-12-19 MED ORDER — HYDROCHLOROTHIAZIDE 25 MG PO TABS: 25.0000 mg | ORAL_TABLET | Freq: Every day | ORAL | Status: DC

## 2013-12-19 NOTE — Progress Notes (Unsigned)
Pt. Is 15 days pp and called by home health nurse about BP in the 166/98 and 164/102 range.  Have called in BP med for her--she is to return for BP check in 2 days.

## 2013-12-25 ENCOUNTER — Encounter: Payer: Self-pay | Admitting: Cardiovascular Disease

## 2013-12-27 ENCOUNTER — Encounter: Payer: Self-pay | Admitting: Obstetrics & Gynecology

## 2013-12-27 ENCOUNTER — Ambulatory Visit (INDEPENDENT_AMBULATORY_CARE_PROVIDER_SITE_OTHER): Payer: Medicaid Other | Admitting: Obstetrics & Gynecology

## 2013-12-27 DIAGNOSIS — Z3042 Encounter for surveillance of injectable contraceptive: Secondary | ICD-10-CM

## 2013-12-27 DIAGNOSIS — D270 Benign neoplasm of right ovary: Secondary | ICD-10-CM

## 2013-12-27 DIAGNOSIS — D279 Benign neoplasm of unspecified ovary: Secondary | ICD-10-CM

## 2013-12-27 DIAGNOSIS — Z302 Encounter for sterilization: Secondary | ICD-10-CM

## 2013-12-27 DIAGNOSIS — Z3049 Encounter for surveillance of other contraceptives: Secondary | ICD-10-CM

## 2013-12-27 MED ORDER — MEDROXYPROGESTERONE ACETATE 104 MG/0.65ML ~~LOC~~ SUSP
104.0000 mg | Freq: Once | SUBCUTANEOUS | Status: AC
  Administered 2013-12-27: 104 mg via SUBCUTANEOUS

## 2013-12-27 NOTE — Progress Notes (Signed)
     Subjective:     Allison Drake is a 33 y.o. [phone] female who presents for a postpartum visit. She is 4 weeks postpartum following a spontaneous vaginal delivery. I have fully reviewed the prenatal and intrapartum course. The delivery was at 68 gestational weeks.  Anesthesia: none. Postpartum course has been uncomplicated. Baby's course has been uncomplicated. Baby is feeding by bottle.  Bleeding no bleeding. Bowel function is normal. Bladder function is normal. Patient is not sexually active. Contraception method is Depo-Provera injections, and desires interval bilateral salpingectomy in addition to laparoscopic right ovarian cystectomy for her 3 cm dermoid cyst. Postpartum depression screening: negative.  The following portions of the patient's history were reviewed and updated as appropriate: allergies, current medications, past family history, past medical history, past social history, past surgical history and problem list. Normal pap and negative HPV on 10/11/13.  Review of Systems Pertinent items are noted in HPI.   Objective:    BP 140/87  Pulse 86  Ht 5\' 9"  (1.753 m)  Wt 180 lb 3.2 oz (81.738 kg)  BMI 26.60 kg/m2  Breastfeeding? No  General:  alert and no distress   Breasts:  inspection negative, no nipple discharge or bleeding, no masses or nodularity palpable  Lungs: clear to auscultation bilaterally  Heart:  regular rate and rhythm  Abdomen: soft, non-tender; bowel sounds normal; no masses,  no organomegaly   Vulva:  normal  Vagina: normal vagina  Cervix:  multiparous appearance  Corpus: normal size, contour, position, consistency, mobility, non-tender  Adnexa:  normal adnexa and no mass, fullness, tenderness  Rectal Exam: Not performed.        Assessment:   Normal postpartum exam. Pap smear not done at today's visit.   Plan:    1. Contraception: Depo-Provera injections given today.  Patient desires permanent sterilization with bilateral salpingectomy.  Other  reversible forms of contraception were discussed with patient; she declines all other modalities. Risks of procedure discussed with patient including but not limited to: risk of regret, permanence of method, bleeding, infection, injury to surrounding organs and need for additional procedures.  Failure risk of <1% was also discussed with patient.  Patient verbalized understanding of these risks and wants to proceed with sterilization.  Medicaid papers signed 10/11/13.   2. Dermoid cyst: Will be removed at same time as aforementioned surgery.She was told that she will be contacted by our surgical scheduler regarding the time and date of her surgery; routine preoperative instructions of having nothing to eat or drink after midnight on the day prior to surgery and also coming to the hospital 1.5 hours prior to her time of surgery were also emphasized.  She was told she may be called for a preoperative appointment about a week prior to surgery and will be given further preoperative instructions at that visit. Printed patient education handouts about the procedure were given to the patient to review at home.   Verita Schneiders, MD, [place], [facility]

## 2013-12-27 NOTE — Patient Instructions (Signed)
Laparoscopic BilateralTubal Removal/Laparoscopic Bilateral Salpingectomy Laparoscopic tubal removal is a procedure that removes the fallopian tubes at a time other than right after childbirth. By removing the fallopian tubes, the eggs that are released from the ovaries cannot enter the uterus and sperm cannot reach the egg. This is more effective than tubal ligation which is also known as getting your "tubes tied." Tubal removal is done so you will not be able to get pregnant or have a baby.  This procedure is permanent and irreversible. If you want to have future pregnancies, you should not have this procedure.  LET YOUR CAREGIVER KNOW ABOUT: Allergies to food or medicine. Medicines taken, including vitamins, herbs, eyedrops, over-the-counter medicines, and creams. Use of steroids (by mouth or creams). Previous problems with numbing medicines. History of bleeding problems or blood clots. Any recent colds or infections. Previous surgery. Other health problems, including diabetes and kidney problems. Possibility of pregnancy, if this applies. Any past pregnancies. RISKS AND COMPLICATIONS  Infection. Bleeding. Injury to surrounding organs. Anesthetic side effects. Failure of the procedure. Ectopic pregnancy. Future regret about having the procedure done. BEFORE THE PROCEDURE Do not take aspirin or blood thinners a week before the procedure or as directed. This can cause bleeding. Do not eat or drink anything 6 to 8 hours before the procedure. PROCEDURE  You may be given a medicine to help you relax (sedative) before the procedure. You will be given a medicine to make you sleep (general anesthetic) during the procedure. A tube will be put down your throat to help your breath while under general anesthesia. Two small cuts (incisions) are made in the lower abdominal area and one incision is made near the belly button. Your abdominal area will be inflated with a safe gas (carbon dioxide). This  helps give the surgeon room to operate, visualize, and helps the surgeon avoid other organs. A thin, lighted tube (laparoscope) with a camera attached is inserted into your abdomen through the incision near the belly button. Other small instruments are also inserted through the other abdominal incisions. The fallopian tubes are located and are removed. After the fallopian tubes are removed, the gas is released from the abdomen. The incisions will be closed with stitches (sutures), and Dermabond. A bandage may be placed over the incisions. AFTER THE PROCEDURE  You will also have some mild abdominal discomfort for 3-7 days. You will be given pain medicine to ease any discomfort. As long as there are no problems, you may be allowed to go home. Someone will need to drive you home and be with you for at least 24 hours once home. You may have some mild discomfort in the throat. This is from the tube placed in your throat while you were sleeping. You may experience discomfort in the shoulder area from some trapped air between the liver and diaphragm. This sensation is normal and will slowly go away on its own. HOME CARE INSTRUCTIONS  Take all medicines as directed. Only take over-the-counter or prescription medicines for pain, discomfort, or fever as directed by your caregiver. Resume daily activities as directed. Showers are preferred over baths. You may resume sexual activities in 1 week or as directed. Do not drive while taking narcotics. SEEK MEDICAL CARE IF: . There is increasing abdominal pain. You feel lightheaded or faint. You have the chills. You have an oral temperature above 102 F (38.9 C). There is pus-like (purulent) drainage from any of the wounds. You are unable to pass gas or have a   bowel movement. You feel sick to your stomach (nauseous) or throw up (vomit). MAKE SURE YOU:  Understand these instructions. Will watch your condition. Will get help right away if you are not doing  well or get worse.  ExitCare Patient Information 2013 [employer].    

## 2014-01-14 ENCOUNTER — Encounter (HOSPITAL_COMMUNITY): Payer: Self-pay | Admitting: Pharmacy Technician

## 2014-01-23 ENCOUNTER — Inpatient Hospital Stay (HOSPITAL_COMMUNITY): Admission: RE | Admit: 2014-01-23 | Payer: Medicaid Other | Source: Ambulatory Visit

## 2014-01-23 NOTE — Patient Instructions (Signed)
   Your procedure is scheduled on:  Tuesday, July 14  Enter through the Main Entrance of [facility] at:  [place] up the phone at the desk and dial [phone] and inform us of your arrival.  Please call this number if you have any problems the morning of surgery: [phone]  Remember: Do not eat or drink after midnight: Monday Take these medicines the morning of surgery with a SIP OF WATER:  Do not wear jewelry, make-up, or FINGER nail polish No metal in your hair or on your body. Do not wear lotions, powders, perfumes.  You may wear deodorant.  Do not bring valuables to the hospital. Contacts, dentures or bridgework may not be worn into surgery.  Leave suitcase in the car. After Surgery it may be brought to your room. For patients being admitted to the hospital, checkout time is 11:00am the day of discharge.    Patients discharged on the day of surgery will not be allowed to drive home.

## 2014-01-25 ENCOUNTER — Inpatient Hospital Stay (HOSPITAL_COMMUNITY)
Admission: RE | Admit: 2014-01-25 | Discharge: 2014-01-25 | Disposition: A | Discharge status: Home / Self Care | Payer: Medicaid Other | Source: Ambulatory Visit

## 2014-01-25 ENCOUNTER — Encounter (HOSPITAL_COMMUNITY)
Admission: RE | Admit: 2014-01-25 | Discharge: 2014-01-25 | Disposition: A | Discharge status: Home / Self Care | Payer: Medicaid Other | Source: Ambulatory Visit | Attending: Obstetrics & Gynecology | Admitting: Obstetrics & Gynecology

## 2014-01-25 ENCOUNTER — Encounter (HOSPITAL_COMMUNITY): Payer: Self-pay

## 2014-01-25 DIAGNOSIS — Z01812 Encounter for preprocedural laboratory examination: Secondary | ICD-10-CM | POA: Insufficient documentation

## 2014-01-25 HISTORY — DX: Essential (primary) hypertension: I10

## 2014-01-25 HISTORY — DX: Anemia, unspecified: D64.9

## 2014-01-25 LAB — TYPE AND SCREEN
ABO/RH(D): O POS
Antibody Screen: NEGATIVE

## 2014-01-25 LAB — BASIC METABOLIC PANEL
ANION GAP: 8 (ref 5–15)
BUN: 9 mg/dL (ref 6–23)
CO2: 26 meq/L (ref 19–32)
Calcium: 8.8 mg/dL (ref 8.4–10.5)
Chloride: 106 mEq/L (ref 96–112)
Creatinine, Ser: 0.91 mg/dL (ref 0.50–1.10)
GFR calc Af Amer: >90 mL/min (ref >90)
GFR calc non Af Amer: 81 mL/min — ABNORMAL LOW (ref >90)
GLUCOSE: 89 mg/dL (ref 70–99)
Potassium: 4.2 mEq/L (ref 3.7–5.3)
SODIUM: 140 meq/L (ref 137–147)

## 2014-01-25 LAB — CBC
HCT: 34.9 % — ABNORMAL LOW (ref 36.0–46.0)
HEMOGLOBIN: 11.7 g/dL — AB (ref 12.0–15.0)
MCH: 26.6 pg (ref 26.0–34.0)
MCHC: 33.5 g/dL (ref 30.0–36.0)
MCV: 79.3 fL (ref 78.0–100.0)
Platelets: 180 10*3/uL (ref 150–400)
RBC: 4.4 MIL/uL (ref 3.87–5.11)
RDW: 14.1 % (ref 11.5–15.5)
WBC: 4.6 10*3/uL (ref 4.0–10.5)

## 2014-01-25 LAB — ABO/RH: ABO/RH(D): O POS

## 2014-01-25 NOTE — Patient Instructions (Signed)
   Your procedure is scheduled on: Tuesday, July 14  Enter through the Main Entrance of [facility] at:  [place] up the phone at the desk and dial [phone] and inform us of your arrival.  Please call this number if you have any problems the morning of surgery: [phone]  Remember: Do not eat food after midnight: Monday Take these medicines the morning of surgery with a SIP OF WATER:  HCTZ  Do not wear jewelry, make-up, or FINGER nail polish No metal in your hair or on your body. Do not wear lotions, powders, perfumes.  You may wear deodorant.  Do not bring valuables to the hospital. Contacts, dentures or bridgework may not be worn into surgery.  Patients discharged on the day of surgery will not be allowed to drive home.  Home with mother [facility] cell [phone].

## 2014-01-29 ENCOUNTER — Ambulatory Visit (HOSPITAL_COMMUNITY): Payer: Medicaid Other | Admitting: Certified Registered Nurse Anesthetist

## 2014-01-29 ENCOUNTER — Encounter (HOSPITAL_COMMUNITY): Payer: Self-pay | Admitting: *Deleted

## 2014-01-29 ENCOUNTER — Encounter (HOSPITAL_COMMUNITY): Payer: Medicaid Other | Admitting: Certified Registered Nurse Anesthetist

## 2014-01-29 ENCOUNTER — Encounter (HOSPITAL_COMMUNITY)
Admission: RE | Disposition: A | Discharge status: Home / Self Care | Payer: Self-pay | Source: Ambulatory Visit | Attending: Obstetrics & Gynecology

## 2014-01-29 ENCOUNTER — Ambulatory Visit (HOSPITAL_COMMUNITY)
Admission: RE | Admit: 2014-01-29 | Discharge: 2014-01-29 | Disposition: A | Discharge status: Home / Self Care | Payer: Medicaid Other | Source: Ambulatory Visit | Attending: Obstetrics & Gynecology | Admitting: Obstetrics & Gynecology

## 2014-01-29 DIAGNOSIS — I10 Essential (primary) hypertension: Secondary | ICD-10-CM | POA: Insufficient documentation

## 2014-01-29 DIAGNOSIS — D279 Benign neoplasm of unspecified ovary: Secondary | ICD-10-CM | POA: Diagnosis not present

## 2014-01-29 DIAGNOSIS — D649 Anemia, unspecified: Secondary | ICD-10-CM | POA: Insufficient documentation

## 2014-01-29 DIAGNOSIS — Z302 Encounter for sterilization: Secondary | ICD-10-CM | POA: Insufficient documentation

## 2014-01-29 DIAGNOSIS — F17200 Nicotine dependence, unspecified, uncomplicated: Secondary | ICD-10-CM | POA: Diagnosis not present

## 2014-01-29 DIAGNOSIS — D270 Benign neoplasm of right ovary: Secondary | ICD-10-CM | POA: Diagnosis present

## 2014-01-29 HISTORY — PX: LAPAROSCOPY: SHX197

## 2014-01-29 LAB — PREGNANCY, URINE: PREG TEST UR: NEGATIVE

## 2014-01-29 SURGERY — LAPAROSCOPY OPERATIVE
Anesthesia: General | Site: Abdomen

## 2014-01-29 MED ORDER — ROCURONIUM BROMIDE 100 MG/10ML IV SOLN
INTRAVENOUS | Status: AC
  Filled 2014-01-29: qty 1

## 2014-01-29 MED ORDER — MEPERIDINE HCL 25 MG/ML IJ SOLN: 6.2500 mg | INTRAMUSCULAR | Status: DC | PRN

## 2014-01-29 MED ORDER — OXYCODONE HCL 5 MG PO TABS: 5.0000 mg | ORAL_TABLET | Freq: Once | ORAL | Status: DC | PRN

## 2014-01-29 MED ORDER — ROCURONIUM BROMIDE 100 MG/10ML IV SOLN
INTRAVENOUS | Status: DC | PRN
  Administered 2014-01-29: 5 mg via INTRAVENOUS
  Administered 2014-01-29: 30 mg via INTRAVENOUS

## 2014-01-29 MED ORDER — DEXAMETHASONE SODIUM PHOSPHATE 10 MG/ML IJ SOLN
INTRAMUSCULAR | Status: DC | PRN
  Administered 2014-01-29: 10 mg via INTRAVENOUS

## 2014-01-29 MED ORDER — BUPIVACAINE HCL (PF) 0.5 % IJ SOLN
INTRAMUSCULAR | Status: AC
  Filled 2014-01-29: qty 30

## 2014-01-29 MED ORDER — CEFAZOLIN SODIUM-DEXTROSE 2-3 GM-% IV SOLR
2.0000 g | INTRAVENOUS | Status: AC
  Administered 2014-01-29: 2 g via INTRAVENOUS

## 2014-01-29 MED ORDER — OXYCODONE-ACETAMINOPHEN 5-325 MG PO TABS: 1.0000 | ORAL_TABLET | Freq: Four times a day (QID) | ORAL | Status: DC | PRN

## 2014-01-29 MED ORDER — NEOSTIGMINE METHYLSULFATE 10 MG/10ML IV SOLN
INTRAVENOUS | Status: DC | PRN
  Administered 2014-01-29: 3 mg via INTRAVENOUS

## 2014-01-29 MED ORDER — LIDOCAINE HCL (CARDIAC) 20 MG/ML IV SOLN
INTRAVENOUS | Status: DC | PRN
  Administered 2014-01-29: 80 mg via INTRAVENOUS

## 2014-01-29 MED ORDER — KETOROLAC TROMETHAMINE 30 MG/ML IJ SOLN
INTRAMUSCULAR | Status: DC | PRN
  Administered 2014-01-29: 30 mg via INTRAVENOUS

## 2014-01-29 MED ORDER — HYDROMORPHONE HCL PF 1 MG/ML IJ SOLN
INTRAMUSCULAR | Status: DC | PRN
  Administered 2014-01-29: 1 mg via INTRAVENOUS

## 2014-01-29 MED ORDER — ONDANSETRON HCL 4 MG/2ML IJ SOLN
INTRAMUSCULAR | Status: DC | PRN
  Administered 2014-01-29: 4 mg via INTRAVENOUS

## 2014-01-29 MED ORDER — FENTANYL CITRATE 0.05 MG/ML IJ SOLN
INTRAMUSCULAR | Status: AC
  Filled 2014-01-29: qty 5

## 2014-01-29 MED ORDER — GLYCOPYRROLATE 0.2 MG/ML IJ SOLN
INTRAMUSCULAR | Status: DC | PRN
  Administered 2014-01-29: 0.2 mg via INTRAVENOUS
  Administered 2014-01-29: 0.4 mg via INTRAVENOUS

## 2014-01-29 MED ORDER — METOCLOPRAMIDE HCL 5 MG/ML IJ SOLN
10.0000 mg | Freq: Once | INTRAMUSCULAR | Status: AC | PRN
  Administered 2014-01-29: 10 mg via INTRAVENOUS

## 2014-01-29 MED ORDER — CEFAZOLIN SODIUM-DEXTROSE 2-3 GM-% IV SOLR
INTRAVENOUS | Status: AC
  Filled 2014-01-29: qty 50

## 2014-01-29 MED ORDER — FENTANYL CITRATE 0.05 MG/ML IJ SOLN
INTRAMUSCULAR | Status: DC | PRN
  Administered 2014-01-29: 50 ug via INTRAVENOUS
  Administered 2014-01-29: 100 ug via INTRAVENOUS
  Administered 2014-01-29 (×2): 50 ug via INTRAVENOUS

## 2014-01-29 MED ORDER — MIDAZOLAM HCL 2 MG/2ML IJ SOLN
INTRAMUSCULAR | Status: DC | PRN
  Administered 2014-01-29: 2 mg via INTRAVENOUS

## 2014-01-29 MED ORDER — METOCLOPRAMIDE HCL 5 MG/ML IJ SOLN
INTRAMUSCULAR | Status: AC
  Filled 2014-01-29: qty 2

## 2014-01-29 MED ORDER — LACTATED RINGERS IR SOLN
Status: DC | PRN
  Administered 2014-01-29: 3000 mL

## 2014-01-29 MED ORDER — PROPOFOL 10 MG/ML IV BOLUS
INTRAVENOUS | Status: DC | PRN
  Administered 2014-01-29: 150 mg via INTRAVENOUS

## 2014-01-29 MED ORDER — DEXAMETHASONE SODIUM PHOSPHATE 10 MG/ML IJ SOLN
INTRAMUSCULAR | Status: AC
  Filled 2014-01-29: qty 1

## 2014-01-29 MED ORDER — IBUPROFEN 600 MG PO TABS: 600.0000 mg | ORAL_TABLET | Freq: Four times a day (QID) | ORAL | Status: DC | PRN

## 2014-01-29 MED ORDER — GLYCOPYRROLATE 0.2 MG/ML IJ SOLN
INTRAMUSCULAR | Status: AC
  Filled 2014-01-29: qty 3

## 2014-01-29 MED ORDER — BUPIVACAINE HCL (PF) 0.5 % IJ SOLN
INTRAMUSCULAR | Status: DC | PRN
  Administered 2014-01-29: 24 mL

## 2014-01-29 MED ORDER — FENTANYL CITRATE 0.05 MG/ML IJ SOLN: 25.0000 ug | INTRAMUSCULAR | Status: DC | PRN

## 2014-01-29 MED ORDER — HYDROMORPHONE HCL PF 1 MG/ML IJ SOLN
INTRAMUSCULAR | Status: AC
  Filled 2014-01-29: qty 1

## 2014-01-29 MED ORDER — NEOSTIGMINE METHYLSULFATE 10 MG/10ML IV SOLN
INTRAVENOUS | Status: AC
  Filled 2014-01-29: qty 1

## 2014-01-29 MED ORDER — ONDANSETRON HCL 4 MG/2ML IJ SOLN
INTRAMUSCULAR | Status: AC
  Filled 2014-01-29: qty 2

## 2014-01-29 MED ORDER — MIDAZOLAM HCL 2 MG/2ML IJ SOLN
INTRAMUSCULAR | Status: AC
  Filled 2014-01-29: qty 2

## 2014-01-29 MED ORDER — DOCUSATE SODIUM 100 MG PO CAPS: 100.0000 mg | ORAL_CAPSULE | Freq: Two times a day (BID) | ORAL | Status: DC | PRN

## 2014-01-29 MED ORDER — PROPOFOL 10 MG/ML IV EMUL
INTRAVENOUS | Status: AC
  Filled 2014-01-29: qty 20

## 2014-01-29 MED ORDER — LACTATED RINGERS IV SOLN
INTRAVENOUS | Status: DC
  Administered 2014-01-29 (×3): via INTRAVENOUS

## 2014-01-29 MED ORDER — LIDOCAINE HCL (CARDIAC) 20 MG/ML IV SOLN
INTRAVENOUS | Status: AC
  Filled 2014-01-29: qty 5

## 2014-01-29 MED ORDER — LACTATED RINGERS IV SOLN: INTRAVENOUS | Status: DC

## 2014-01-29 MED ORDER — OXYCODONE HCL 5 MG/5ML PO SOLN: 5.0000 mg | Freq: Once | ORAL | Status: DC | PRN

## 2014-01-29 SURGICAL SUPPLY — 28 items
ADH SKN CLS APL DERMABOND .7 (GAUZE/BANDAGES/DRESSINGS) ×1
BAG SPEC RTRVL LRG 6X4 10 (ENDOMECHANICALS) ×2
CABLE HIGH FREQUENCY MONO STRZ (ELECTRODE) ×2 IMPLANT
CHLORAPREP W/TINT 26ML (MISCELLANEOUS) ×3 IMPLANT
CLOTH BEACON ORANGE TIMEOUT ST (SAFETY) ×3 IMPLANT
DERMABOND ADVANCED (GAUZE/BANDAGES/DRESSINGS) ×2
DERMABOND ADVANCED .7 DNX12 (GAUZE/BANDAGES/DRESSINGS) ×1 IMPLANT
DRSG COVADERM PLUS 2X2 (GAUZE/BANDAGES/DRESSINGS) ×4 IMPLANT
DRSG OPSITE POSTOP 3X4 (GAUZE/BANDAGES/DRESSINGS) ×2 IMPLANT
GLOVE BIOGEL PI IND STRL 7.0 (GLOVE) ×2 IMPLANT
GLOVE BIOGEL PI INDICATOR 7.0 (GLOVE) ×4
GLOVE ECLIPSE 7.0 STRL STRAW (GLOVE) ×6 IMPLANT
GOWN STRL REUS W/TWL LRG LVL3 (GOWN DISPOSABLE) ×9 IMPLANT
NEEDLE INSUFFLATION 120MM (ENDOMECHANICALS) ×3 IMPLANT
NS IRRIG 1000ML POUR BTL (IV SOLUTION) ×3 IMPLANT
PACK LAPAROSCOPY BASIN (CUSTOM PROCEDURE TRAY) ×3 IMPLANT
POUCH SPECIMEN RETRIEVAL 10MM (ENDOMECHANICALS) ×4 IMPLANT
PROTECTOR NERVE ULNAR (MISCELLANEOUS) ×6 IMPLANT
SET IRRIG TUBING LAPAROSCOPIC (IRRIGATION / IRRIGATOR) ×2 IMPLANT
SHEARS HARMONIC ACE PLUS 36CM (ENDOMECHANICALS) ×2 IMPLANT
SUT VIC AB 3-0 X1 27 (SUTURE) ×3 IMPLANT
SUT VICRYL 0 UR6 27IN ABS (SUTURE) ×6 IMPLANT
TOWEL OR 17X24 6PK STRL BLUE (TOWEL DISPOSABLE) ×6 IMPLANT
TRAY FOLEY CATH 14FR (SET/KITS/TRAYS/PACK) ×3 IMPLANT
TROCAR XCEL NON-BLD 11X100MML (ENDOMECHANICALS) ×5 IMPLANT
TROCAR XCEL NON-BLD 5MMX100MML (ENDOMECHANICALS) ×6 IMPLANT
WARMER LAPAROSCOPE (MISCELLANEOUS) ×3 IMPLANT
WATER STERILE IRR 1000ML POUR (IV SOLUTION) IMPLANT

## 2014-01-29 NOTE — Anesthesia Procedure Notes (Signed)
Procedure Name: Intubation Date/Time: 01/29/2014 11:23 AM Performed by: Mayer Camel, Jaycelynn Knickerbocker A Pre-anesthesia Checklist: Patient identified, Timeout performed, Emergency Drugs available, Suction available and Patient being monitored Patient Re-evaluated:Patient Re-evaluated prior to inductionOxygen Delivery Method: Circle system utilized Preoxygenation: Pre-oxygenation with 100% oxygen Intubation Type: IV induction Ventilation: Mask ventilation without difficulty Laryngoscope Size: Mac and 3 Grade View: Grade I Tube type: Oral Tube size: 7.0 mm Number of attempts: 1 Placement Confirmation: ETT inserted through vocal cords under direct vision,  positive ETCO2 and breath sounds checked- equal and bilateral Secured at: 21 cm Tube secured with: Tape Dental Injury: Teeth and Oropharynx as per pre-operative assessment

## 2014-01-29 NOTE — Anesthesia Preprocedure Evaluation (Signed)
Anesthesia Evaluation  Patient identified by MRN, date of birth, ID band Patient awake    Reviewed: Allergy & Precautions, H&P , NPO status , Patient's Chart, lab work & pertinent test results  Airway Mallampati: II TM Distance: >3 FB Neck ROM: Full    Dental no notable dental hx. (+) Teeth Intact   Pulmonary Current Smoker,  breath sounds clear to auscultation  Pulmonary exam normal       Cardiovascular hypertension, Pt. on medications Rhythm:Regular Rate:Normal     Neuro/Psych negative neurological ROS  negative psych ROS   GI/Hepatic negative GI ROS, Neg liver ROS,   Endo/Other  negative endocrine ROS  Renal/GU negative Renal ROS  negative genitourinary   Musculoskeletal negative musculoskeletal ROS (+)   Abdominal   Peds  Hematology  (+) anemia ,   Anesthesia Other Findings   Reproductive/Obstetrics Dermoid cyst right ovary Desires permanent contraception                           Anesthesia Physical Anesthesia Plan  ASA: II  Anesthesia Plan: General   Post-op Pain Management:    Induction: Intravenous  Airway Management Planned: Oral ETT  Additional Equipment:   Intra-op Plan:   Post-operative Plan: Extubation in OR  Informed Consent: I have reviewed the patients History and Physical, chart, labs and discussed the procedure including the risks, benefits and alternatives for the proposed anesthesia with the patient or authorized representative who has indicated his/her understanding and acceptance.   Dental advisory given  Plan Discussed with: Anesthesiologist, CRNA and Surgeon  Anesthesia Plan Comments:         Anesthesia Quick Evaluation

## 2014-01-29 NOTE — Anesthesia Postprocedure Evaluation (Signed)
  Anesthesia Post-op Note  Patient: Allison Drake  Procedure(s) Performed: Procedure(s): LAPAROSCOPIC RIGHT OVARIAN CYSTECTOMY, BILATERAL SALPINGECTOMY  (N/A)  Patient Location: PACU  Anesthesia Type:General  Level of Consciousness: awake, alert  and oriented  Airway and Oxygen Therapy: Patient Spontanous Breathing  Post-op Pain: mild  Post-op Assessment: Post-op Vital signs reviewed, Patient's Cardiovascular Status Stable, Respiratory Function Stable, Patent Airway, No signs of Nausea or vomiting and Pain level controlled  Post-op Vital Signs: Reviewed and stable  Last Vitals:  Filed Vitals:   01/29/14 1345  BP: 108/49  Pulse: 61  Temp:   Resp: 9    Complications: No apparent anesthesia complications

## 2014-01-29 NOTE — Discharge Instructions (Addendum)
Laparoscopic Surgery - Care After Laparoscopy is a surgical procedure. It is used to diagnose and treat diseases inside the belly(abdomen). It is usually a brief, common, and relatively simple procedure. The laparoscopeis a thin, lighted, pencil-sized instrument. It is like a telescope. It is inserted into your abdomen through a small cut (incision). Your caregiver can look at the organs inside your body through this instrument.  She can see if there is anything abnormal. Laparoscopy can be done either in a hospital or outpatient clinic. You may be given a mild sedative to help you relax before the procedure. Once in the operating room, you will be given a drug to make you sleep (general anesthesia). Laparoscopy usually lasts about 1 hour. After the procedure, you will be monitored in a recovery area until you are stable and doing well. Once you are home, it may take 3 to 7 days to fully recover.  Laparoscopy has relatively few risks. Your caregiver will discuss the risks with you before the procedure. Some problems that can occur include: RISKS AND COMPLICATIONS  Allergies to medicines. Difficulty breathing. Bleeding. Infection. Damage to other surrounding structures HOME CARE INSTRUCTIONS  Infection. Bleeding. Damage to other organs. Anesthetic side effects.  Need for additional procedures such as open procedures/laparotomy PROCEDURE Once you receive anesthesia, your surgeon inflates the abdomen with a harmless gas (carbon dioxide). This makes the organs easier to see. The laparoscope is inserted into the abdomen through a small incision. This allows your surgeon to see into the abdomen. Other small instruments are also inserted into the abdomen through other small openings. Many surgeons attach a video camera to the laparoscope to enlarge the view. During a laparoscopy, the surgeon may be looking for inflammation, infection, or cancer.  The surgeon may also need to take out certain organs or  take tissue samples (biopsies). The specimens are sent to a specialist in looking at cells and tissue samples (pathologist). The pathologist examines them under a microscope to help to diagnose or confirm a disease. AFTER THE PROCEDURE  The incisions are closed with stitches (sutures) and Dermabond. Because these incisions are small (usually less than 1/2 inch), there is usually minimal discomfort after the procedure. There may also be discomfort from the instrument placement incisions in the abdomen. You will be given pain medicine to ease any discomfort. You will rest in a recovery room for 1-2 hours until you are stable and doing well. You may have some mild discomfort in the throat. This is from the tube placed in your throat while you were sleeping. You may experience discomfort in the shoulder area from some trapped air between the liver and diaphragm. This sensation is normal and will slowly go away on its own. The recovery time is shortened as long as there are no complications. You will rest in a recovery room until stable and doing well. As long as there are no complications, you may be allowed to go home. Someone will need to drive you home and be with you for at least 24 hours once home. FINDING OUT THE RESULTS You will be called with the results of the pathology and will discuss these results with  your caregiver during your postoperative appointment. Do not assume everything is normal if you have not heard from your caregiver or the medical facility. It is important for you to follow up on all of your results. HOME CARE INSTRUCTIONS  Take all medicines as directed. Only take over-the-counter or prescription medicines for pain, discomfort,   INSTRUCTIONS   Take all medicines as directed.  Only take over-the-counter or prescription medicines for pain, discomfort, or fever as directed by your caregiver.  Resume daily activities as directed.  Showers are preferred over baths.  You may resume sexual activities in 1 week or as directed.  Do not drive while taking  narcotics. SEEK MEDICAL CARE IF:  There is increasing abdominal pain.  You feel lightheaded or faint.  You have the chills.  You have an oral temperature above 102 F (38.9 C).  There is pus-like (purulent) drainage from any of the wounds.  You are unable to pass gas or have a bowel movement.  You feel sick to your stomach (nauseous) or throw up (vomit). MAKE SURE YOU:   Understand these instructions.  Will watch your condition.  Will get help right away if you are not doing well or get worse.  Exit[employer] Patient Information 2013 [place].  Post Anesthesia Home Care Instructions  Activity: Get plenty of rest for the remainder of the day. A responsible adult should stay with you for 24 hours following the procedure.  For the next 24 hours, DO NOT: -Drive a car -Paediatric nurse -Drink alcoholic beverages -Take any medication unless instructed by your physician -Make any legal decisions or sign important papers.  Meals: Start with liquid foods such as gelatin or soup. Progress to regular foods as tolerated. Avoid greasy, spicy, heavy foods. If nausea and/or vomiting occur, drink only clear liquids until the nausea and/or vomiting subsides. Call your physician if vomiting continues.  Special Instructions/Symptoms: Your throat may feel dry or sore from the anesthesia or the breathing tube placed in your throat during surgery. If this causes discomfort, gargle with warm salt water. The discomfort should disappear within 24 hours.

## 2014-01-29 NOTE — H&P (Signed)
Preoperative History and Physical  Allison Drake is a 33 y.o. [phone] here for scheduled surgery; desires interval bilateral salpingectomy in addition to laparoscopic right ovarian cystectomy for her 3 cm dermoid cyst.  Proposed surgery: Laparoscopic right ovarian cystectomy and bilateral salpingectomy  Past Medical History  Diagnosis Date  . Palpitations     history -stress related  . Vaginal Pap smear, abnormal     LEEP  . SVD (spontaneous vaginal delivery) x 3  . Hypertension   . Anemia     history   Past Surgical History  Procedure Laterality Date  . Induced abortion    . Leep    . Tonsillectomy     OB History   Grav Para Term Preterm Abortions TAB SAB Ect Mult Living   4 3 3  0 1 1 0 0 0 3    Patient denies any recent cervical dysplasia or STIs.  Prescriptions prior to admission  Medication Sig Dispense Refill  . hydrochlorothiazide (HYDRODIURIL) 25 MG tablet Take 1 tablet (25 mg total) by mouth daily.  30 tablet  3    No Known Allergies  Social History:   reports that she has been smoking Cigarettes.  She has a 7 pack-year smoking history. She has never used smokeless tobacco. She reports that she does not drink alcohol or use illicit drugs.  Family History  Problem Relation Age of Onset  . Cancer Maternal Uncle   . Miscarriages / Stillbirths Maternal Grandmother   . Heart disease Maternal Grandmother   . Miscarriages / Stillbirths Maternal Grandfather   . Heart disease Maternal Grandfather   . Heart disease Paternal Grandfather   . Miscarriages / Stillbirths Paternal Grandfather     Review of Systems: Noncontributory  PHYSICAL EXAM: Blood pressure 147/92, pulse 62, temperature 98.6 F (37 C), temperature source Oral, SpO2 100.00%, not currently breastfeeding. General appearance - alert, well appearing, and in no distress Chest - clear to auscultation, no wheezes, rales or rhonchi, symmetric air entry Heart - normal rate and regular rhythm Abdomen -  soft, nontender, nondistended, no masses or organomegaly Pelvic - examination not indicated Extremities - peripheral pulses normal, no pedal edema, no clubbing or cyanosis  Labs: Results for orders placed during the hospital encounter of 01/29/14 (from the past 336 hour(s))  PREGNANCY, URINE   Collection Time    01/29/14  9:45 AM      Result Value Ref Range   Preg Test, Ur NEGATIVE  NEGATIVE  Results for orders placed during the hospital encounter of 01/25/14 (from the past 336 hour(s))  ABO/RH   Collection Time    01/25/14  2:25 PM      Result Value Ref Range   ABO/RH(D) O POS    CBC   Collection Time    01/25/14  2:26 PM      Result Value Ref Range   WBC 4.6  4.0 - 10.5 K/uL   RBC 4.40  3.87 - 5.11 MIL/uL   Hemoglobin 11.7 (*) 12.0 - 15.0 g/dL   HCT 34.9 (*) 36.0 - 46.0 %   MCV 79.3  78.0 - 100.0 fL   MCH 26.6  26.0 - 34.0 pg   MCHC 33.5  30.0 - 36.0 g/dL   RDW 14.1  11.5 - 15.5 %   Platelets 180  150 - 400 K/uL  BASIC METABOLIC PANEL   Collection Time    01/25/14  2:26 PM      Result Value Ref Range   Sodium 140  137 - 147 mEq/L   Potassium 4.2  3.7 - 5.3 mEq/L   Chloride 106  96 - 112 mEq/L   CO2 26  19 - 32 mEq/L   Glucose, Bld 89  70 - 99 mg/dL   BUN 9  6 - 23 mg/dL   Creatinine, Ser 0.91  0.50 - 1.10 mg/dL   Calcium 8.8  8.4 - 10.5 mg/dL   GFR calc non Af Amer 81 (*) >90 mL/min   GFR calc Af Amer >90  >90 mL/min   Anion gap 8  5 - 15  TYPE AND SCREEN   Collection Time    01/25/14  2:26 PM      Result Value Ref Range   ABO/RH(D) O POS     Antibody Screen NEG     Sample Expiration 01/28/2014      Imaging Studies: 10/23/2013 U/S 3 cm R ovarian cystic teratoma.  Assessment: Patient Active Problem List   Diagnosis Date Noted  . Desires Sterilization 12/27/2013  . Dermoid cyst of right ovary 09/28/2013    Plan: Patient will undergo surgical management with laparoscopic right ovarian cystectomy and bilateral salpingectomy.   The risks of surgery were  discussed in detail with the patient including but not limited to: bleeding which may require transfusion or reoperation; infection which may require antibiotics; injury to surrounding organs which may involve bowel, bladder, ureters; risk of rupture of dermoid cyst and contents that could lead to inflammation of the peritoneum; need for additional procedures including laparotomy; thromboembolic phenomenon, surgical site problems and other postoperative/anesthesia complications. Likelihood of success in alleviating the patient's condition was discussed.  For the sterilization, additional risks of regret, permanence of method, failure risk of <1% was also discussed with patient. Patient verbalized understanding of these risks and wants to proceed with sterilization. Medicaid papers signed 10/11/13.   Routine postoperative instructions will be reviewed with the patient and her family in detail after surgery.  The patient concurred with the proposed plan, giving informed written consent for the surgery.  Patient has been NPO since last night she will remain NPO for procedure.  Anesthesia and OR aware.  Preoperative prophylactic antibiotics and SCDs ordered on call to the OR.  To OR when ready.   Verita Schneiders, M.D. 01/29/2014 11:06 AM

## 2014-01-29 NOTE — Op Note (Signed)
Allison Drake PROCEDURE DATE: 01/29/2014   PREOPERATIVE DIAGNOSES:  3 cm right ovarian dermoid cyst, Undesired fertility  POSTOPERATIVE DIAGNOSES:  The same  PROCEDURE:  Laparoscopic Right Ovarian Cystectomy and Bilateral Salpingectomy   SURGEON: Verita Schneiders, MD  ASSISTANT: Lavonia Drafts, MD  ANESTHESIA:  General endotracheal  COMPLICATIONS:  None immediate.  ESTIMATED BLOOD LOSS:  30 ml.  FLUIDS: 1500 ml LR.  URINE OUTPUT:  500 ml of clear urine.  IINDICATIONS: 33 y.o. [id number] with a 3 cm right dermoid cyst and undesired fertility, desires removal of cyst and permanent sterilization. Other reversible forms of contraception were discussed with patient; she declines all other modalities.  Risks of procedure discussed with patient including permanence of sterilization, risk of regret, bleeding, infection, injury to surrounding organs and need for additional procedures including laparotomy.  Failure risk less than 0.5% with increased risk of ectopic gestation if pregnancy occurs was also discussed with patient.  Written informed consent was obtained.    FINDINGS:  Normal uterus, fallopian tubes, and ovaries.  TECHNIQUE:  The patient was taken to the operating room where general anesthesia was obtained without difficulty.  She was then placed in the dorsal lithotomy position and prepared and draped in sterile fashion.  After an adequate timeout was performed, a bivalved speculum was then placed in the patient's vagina, and the anterior lip of cervix grasped with the single-tooth tenaculum.  The uterine manipulator was then advanced into the uterus.  The speculum was removed from the vagina.  Attention was then turned to the patient's abdomen where a 11-mm skin incision was made in the umbilical fold.  The Optiview 11-mm trocar and sleeve were then advanced without difficulty with the laparoscope under direct visualization into the abdomen.  The abdomen was then insufflated with  carbon dioxide gas.  Adequate pneumoperitoneum was obtained.  A survey of the patient's pelvis and abdomen revealed the findings above. Bilateral 5-mm lower quadrant ports  were then placed under direct visualization.  Attention was turned to the right ovary and the cyst was recognized. Using the Harmonic device, an incision was made over the top of the cyst. Using traction and countertraction, the cyst was able to be freed and excised intact from the normal ovary.  The defect was coagulated for hemostasis. The fallopian tubes were transected from the uterine attachments and the underlying mesosalpinx with the Harmonic device allowing for bilateral salpingectomy.  The intact resected ovarian cyst fallopian tubes were then removed from the abdomen under direct visualization.  The operative site was surveyed, and it was found to be hemostatic.   No intraoperative injury to other surrounding organs was noted.  The abdomen was desufflated and all instruments were then removed from the patient's abdomen. The fascial incision of the umbilicus was closed with a 0 Vicryl figure of eight stitch. All skin incisions were closed with 3-0 Vicryl and Dermabond.  The uterine manipulator was removed from the cervix without complications. The patient tolerated the procedure well.  Sponge, lap, and needle counts were correct times two.  The patient was then taken to the recovery room awake, extubated and in stable condition.  The patient will be discharged to home as per PACU criteria.  Routine postoperative instructions given.  She was prescribed Percocet, Ibuprofen and Colace.  She will follow up in the clinic on 02/27/14 for postoperative evaluation.   Verita Schneiders, MD, [place] Attending [place], [facility]

## 2014-01-29 NOTE — Transfer of Care (Signed)
Immediate Anesthesia Transfer of Care Note  Patient: Allison Drake  Procedure(s) Performed: Procedure(s): LAPAROSCOPIC RIGHT OVARIAN CYSTECTOMY, BILATERAL SALPINGECTOMY  (N/A)  Patient Location: PACU  Anesthesia Type:General  Level of Consciousness: awake, alert  and oriented  Airway & Oxygen Therapy: Patient Spontanous Breathing and Patient connected to nasal cannula oxygen  Post-op Assessment: Report given to PACU RN, Post -op Vital signs reviewed and stable and Patient moving all extremities X 4  Post vital signs: Reviewed and stable  Complications: No apparent anesthesia complications

## 2014-01-30 ENCOUNTER — Encounter (HOSPITAL_COMMUNITY): Payer: Self-pay | Admitting: Obstetrics & Gynecology

## 2014-01-31 ENCOUNTER — Telehealth: Payer: Self-pay

## 2014-01-31 NOTE — Telephone Encounter (Signed)
Message copied by Geanie Logan on Thu Jan 31, 2014  8:02 AM ------      Message from: Verita Schneiders A      Created: Wed Jan 30, 2014  5:02 PM       Benign ovarian teratoma on pathology. Benign tubes.  Please call to inform patient of results.       ------

## 2014-01-31 NOTE — Telephone Encounter (Signed)
Attempted to call patient. No answer. Left message stating we are calling with results, non-urgent, please call clinic.

## 2014-02-01 NOTE — Telephone Encounter (Signed)
Contacted patient, gave results, patient had no further questions.

## 2014-02-27 ENCOUNTER — Telehealth: Payer: Self-pay | Admitting: *Deleted

## 2014-02-27 ENCOUNTER — Ambulatory Visit: Payer: Medicaid Other | Admitting: Obstetrics & Gynecology

## 2014-02-27 NOTE — Telephone Encounter (Signed)
Called patient to check on her since she didn't keep her surgical followup today. Patient stated that she is doing well and doesn't desire to keep the appointment. Dr. Harolyn Rutherford informed.

## 2014-04-04 ENCOUNTER — Encounter: Payer: Self-pay | Admitting: *Deleted

## 2014-05-20 ENCOUNTER — Encounter (HOSPITAL_COMMUNITY): Payer: Self-pay | Admitting: Obstetrics & Gynecology

## 2014-08-01 ENCOUNTER — Encounter (HOSPITAL_COMMUNITY): Payer: Self-pay | Admitting: Obstetrics & Gynecology

## 2014-12-08 ENCOUNTER — Emergency Department (HOSPITAL_COMMUNITY)
Admission: EM | Admit: 2014-12-08 | Discharge: 2014-12-08 | Disposition: A | Discharge status: Home / Self Care | Payer: Self-pay | Attending: Emergency Medicine | Admitting: Emergency Medicine

## 2014-12-08 ENCOUNTER — Encounter (HOSPITAL_COMMUNITY): Payer: Self-pay | Admitting: *Deleted

## 2014-12-08 DIAGNOSIS — Z862 Personal history of diseases of the blood and blood-forming organs and certain disorders involving the immune mechanism: Secondary | ICD-10-CM | POA: Insufficient documentation

## 2014-12-08 DIAGNOSIS — I10 Essential (primary) hypertension: Secondary | ICD-10-CM | POA: Insufficient documentation

## 2014-12-08 DIAGNOSIS — K0381 Cracked tooth: Secondary | ICD-10-CM | POA: Insufficient documentation

## 2014-12-08 DIAGNOSIS — Z720 Tobacco use: Secondary | ICD-10-CM | POA: Insufficient documentation

## 2014-12-08 DIAGNOSIS — Z79899 Other long term (current) drug therapy: Secondary | ICD-10-CM | POA: Insufficient documentation

## 2014-12-08 DIAGNOSIS — K047 Periapical abscess without sinus: Secondary | ICD-10-CM | POA: Insufficient documentation

## 2014-12-08 MED ORDER — IBUPROFEN 800 MG PO TABS: 800.0000 mg | ORAL_TABLET | Freq: Three times a day (TID) | ORAL | Status: DC | PRN

## 2014-12-08 MED ORDER — PENICILLIN V POTASSIUM 500 MG PO TABS: 500.0000 mg | ORAL_TABLET | Freq: Four times a day (QID) | ORAL | Status: AC

## 2014-12-08 MED ORDER — HYDROCODONE-ACETAMINOPHEN 5-325 MG PO TABS: 1.0000 | ORAL_TABLET | ORAL | Status: DC | PRN

## 2014-12-08 NOTE — ED Notes (Signed)
Pt reports oral swelling that started this AM.

## 2014-12-08 NOTE — ED Provider Notes (Signed)
CSN: [id number]     Arrival date & time 12/08/14  0707 History   First MD Initiated Contact with Patient 12/08/14 0719     Chief Complaint  Patient presents with  . Oral Swelling     (Consider location/radiation/quality/duration/timing/severity/associated sxs/prior Treatment) HPI   Patient presents with left sided mandibular swelling that began as a knot on her left jawline last week and has progressed into a large area of swelling.  Pt has tooth on the left lower side that needs a root canal - she has been waiting for her dental insurance to kick in to see the oral surgeon her dentist referred her to.  Initially the knot was painful and throbbing.  She took her sister's Tylenol 3 for relief.  Overnight the swelling increased dramatically but the pain subsided.  Denies fevers, sore throat, difficulty swallowing or breathing.    Past Medical History  Diagnosis Date  . Palpitations     history -stress related  . Vaginal Pap smear, abnormal     LEEP  . SVD (spontaneous vaginal delivery) x 3  . Hypertension   . Anemia     history   Past Surgical History  Procedure Laterality Date  . Induced abortion    . Leep    . Tonsillectomy    . Laparoscopy N/A 01/29/2014    Procedure: LAPAROSCOPIC RIGHT OVARIAN CYSTECTOMY, BILATERAL SALPINGECTOMY ;  Surgeon: Osborne Oman, MD;  Location: [facility];  Service: Gynecology;  Laterality: N/A;   Family History  Problem Relation Age of Onset  . Cancer Maternal Uncle   . Miscarriages / Stillbirths Maternal Grandmother   . Heart disease Maternal Grandmother   . Miscarriages / Stillbirths Maternal Grandfather   . Heart disease Maternal Grandfather   . Heart disease Paternal Grandfather   . Miscarriages / Stillbirths Paternal Grandfather    History  Substance Use Topics  . Smoking status: Current Every Day Smoker -- 0.50 packs/day for 14 years    Types: Cigarettes  . Smokeless tobacco: Never Used  . Alcohol Use: No   OB History    Gravida Para  Term Preterm AB TAB SAB Ectopic Multiple Living   4 3 3  0 1 1 0 0 0 3     Review of Systems  All other systems reviewed and are negative.     Allergies  Review of patient's allergies indicates no known allergies.  Home Medications   Prior to Admission medications   Medication Sig Start Date End Date Taking? Authorizing Provider  docusate sodium (COLACE) 100 MG capsule Take 1 capsule (100 mg total) by mouth 2 (two) times daily as needed. 01/29/14   Osborne Oman, MD  hydrochlorothiazide (HYDRODIURIL) 25 MG tablet Take 1 tablet (25 mg total) by mouth daily. 12/19/13   Donnamae Jude, MD  HYDROcodone-acetaminophen (NORCO/VICODIN) 5-325 MG per tablet Take 1 tablet by mouth every 4 (four) hours as needed for moderate pain or severe pain. 12/08/14   Clayton Bibles, PA-C  ibuprofen (ADVIL,MOTRIN) 800 MG tablet Take 1 tablet (800 mg total) by mouth every 8 (eight) hours as needed for mild pain or moderate pain. 12/08/14   Clayton Bibles, PA-C  oxyCODONE-acetaminophen (PERCOCET/ROXICET) 5-325 MG per tablet Take 1-2 tablets by mouth every 6 (six) hours as needed. 01/29/14   Osborne Oman, MD  penicillin v potassium (VEETID) 500 MG tablet Take 1 tablet (500 mg total) by mouth 4 (four) times daily. 12/08/14 12/15/14  Clayton Bibles, PA-C   BP 142/80 mmHg  Pulse 74  Temp(Src) 98.7 F (37.1 C) (Oral)  Resp 20  Ht 5\' 9"  (1.753 m)  Wt 184 lb 1 oz (83.49 kg)  BMI 27.17 kg/m2  SpO2 98%  LMP 12/05/2014  Breastfeeding? No Physical Exam  Constitutional: She appears well-developed and well-nourished. No distress.  HENT:  Head: Normocephalic and atraumatic.  Mouth/Throat: Uvula is midline and oropharynx is clear and moist. Mucous membranes are not dry. No uvula swelling. No oropharyngeal exudate, posterior oropharyngeal edema, posterior oropharyngeal erythema or tonsillar abscesses.  Left lower 2nd bicuspid with remote fracture and adjacent gingival swelling.  Left-sided facial swelling begins at this point.     Neck: Trachea normal, normal range of motion and phonation normal. Neck supple. No tracheal tenderness present. No rigidity. No tracheal deviation, no edema, no erythema and normal range of motion present.  Cardiovascular: Normal rate.   Pulmonary/Chest: Effort normal and breath sounds normal. No stridor.  Lymphadenopathy:    She has no cervical adenopathy.  Neurological: She is alert.  Skin: She is not diaphoretic.  Nursing note and vitals reviewed.   ED Course  Procedures (including critical care time) Labs Review Labs Reviewed - No data to display  Imaging Review No results found.   EKG Interpretation None      MDM   Final diagnoses:  Dental abscess    Afebrile, nontoxic patient with new dental pain, obvious abscess. Doubt Ludwig's angina.  D/C home with antibiotic, pain medication and dental follow up.  Pt has a Pharmacist, community and an Chief Financial Officer.  There are no dentists/oral surgeons on call for adults today.  I have given her additional dental resourcs if needed and encouraged close follow up. Discussed findings, treatment, and follow up  with patient.  Pt given return precautions.  Pt verbalizes understanding and agrees with plan.         Clayton Bibles, PA-C 12/08/14 0745  Wandra Arthurs, MD 12/08/14 [phone]

## 2014-12-08 NOTE — Discharge Instructions (Signed)
Read the information below.  Use the prescribed medication as directed.  Please discuss all new medications with your pharmacist.  Do not take additional tylenol while taking the prescribed pain medication to avoid overdose.  You may return to the Emergency Department at any time for worsening condition or any new symptoms that concern you.   If there is any possibility that you might be pregnant, please let your health care provider know and discuss this with the pharmacist to ensure medication safety.    Please call the dentist to schedule a close follow up appointment.  If you develop fevers, swelling in your face, difficulty swallowing or breathing, return to the ER immediately for a recheck.     Dental Abscess A dental abscess is a collection of infected fluid (pus) from a bacterial infection in the inner part of the tooth (pulp). It usually occurs at the end of the tooth's root.  CAUSES   Severe tooth decay.  Trauma to the tooth that allows bacteria to enter into the pulp, such as a broken or chipped tooth. SYMPTOMS   Severe pain in and around the infected tooth.  Swelling and redness around the abscessed tooth or in the mouth or face.  Tenderness.  Pus drainage.  Bad breath.  Bitter taste in the mouth.  Difficulty swallowing.  Difficulty opening the mouth.  Nausea.  Vomiting.  Chills.  Swollen neck glands. DIAGNOSIS   A medical and dental history will be taken.  An examination will be performed by tapping on the abscessed tooth.  X-rays may be taken of the tooth to identify the abscess. TREATMENT The goal of treatment is to eliminate the infection. You may be prescribed antibiotic medicine to stop the infection from spreading. A root canal may be performed to save the tooth. If the tooth cannot be saved, it may be pulled (extracted) and the abscess may be drained.  HOME CARE INSTRUCTIONS  Only take over-the-counter or prescription medicines for pain, fever, or  discomfort as directed by your caregiver.  Rinse your mouth (gargle) often with salt water ( tsp salt in 8 oz [250 ml] of warm water) to relieve pain or swelling.  Do not drive after taking pain medicine (narcotics).  Do not apply heat to the outside of your face.  Return to your dentist for further treatment as directed. SEEK MEDICAL CARE IF:  Your pain is not helped by medicine.  Your pain is getting worse instead of better. SEEK IMMEDIATE MEDICAL CARE IF:  You have a fever or persistent symptoms for more than 2-3 days.  You have a fever and your symptoms suddenly get worse.  You have chills or a very bad headache.  You have problems breathing or swallowing.  You have trouble opening your mouth.  You have swelling in the neck or around the eye. Document Released: 07/05/2005 Document Revised: 03/29/2012 Document Reviewed: 10/13/2010 [facility] Patient Information 2015 [place], [place]. This information is not intended to replace advice given to you by your health care provider. Make sure you discuss any questions you have with your health care provider.    Emergency Department Resource Guide 1) Find a Doctor and Pay Out of Pocket Although you won't have to find out who is covered by your insurance plan, it is a good idea to ask around and get recommendations. You will then need to call the office and see if the doctor you have chosen will accept you as a new patient and what types of options  they offer for patients who are self-pay. Some doctors offer discounts or will set up payment plans for their patients who do not have insurance, but you will need to ask so you aren't surprised when you get to your appointment.  2) Contact Your Local Health Department Not all health departments have doctors that can see patients for sick visits, but many do, so it is worth a call to see if yours does. If you don't know where your local health department is, you can check in your phone book.  The CDC also has a tool to help you locate your state's health department, and many state websites also have listings of all of their local health departments.  3) Find a [facility] If your illness is not likely to be very severe or complicated, you may want to try a walk in clinic. These are popping up all over the country in pharmacies, drugstores, and shopping centers. They're usually staffed by nurse practitioners or physician assistants that have been trained to treat common illnesses and complaints. They're usually fairly quick and inexpensive. However, if you have serious medical issues or chronic medical problems, these are probably not your best option.  No Primary Care Doctor: - Call Health Connect at  [phone] - they can help you locate a primary care doctor that  accepts your insurance, provides certain services, etc. - Physician Referral Service- [phone]  Chronic Pain Problems: Organization         Address  Phone   Notes  [facility]  [phone] Patients need to be referred by their primary care doctor.   Medication Assistance: Organization         Address  Phone   Notes  [facility] Medication [facility] [address] [place], [place] [address] [phone] --Must be a resident of [facility] -- Must have NO insurance coverage whatsoever (no Medicaid/ Medicare, etc.) -- The pt. MUST have a primary care doctor that directs their care regularly and follows them in the community   MedAssist  [phone]   [employer]  [phone]    Agencies that provide inexpensive medical care: Organization         Address  Phone   Notes  [place]  [phone]   [facility]    [phone]   [facility] [place], [place] [address] [phone]   [place] [address] [place] [phone]   Planned Parenthood     [phone]   [facility]    [phone]   [place] and [place] [address], [place] Phone:  [phone], Fax:  [phone] Hours of Operation:  9 am - 6 pm, M-F.  Also accepts Medicaid/Medicare and self-pay.  [facility] [address], [address], [place] Phone: [phone], Fax: [phone]. Hours of Operation:  8:30 am - 5:30 pm, M-F.  Also accepts Medicaid and self-pay.  [facility] [place] [address], [place] Phone: [phone]   [place], [place], [place] [phone], Ext. [phone] Mondays & Thursdays: 7-9 AM.  First 15 patients are seen on a first come, first serve basis.    [place] Providers:  Organization         Address  Phone   Notes  [employer]  [facility] [address], [place] [phone] Also accepts self-pay patients.  [facility] [address], [place]  [phone]   [place], [address], [place] [phone]   [facility] [address] [place] [phone]   Lucianne Lei [address], [place]   [phone] Only accepts [place] Access [place] patients after they have their name applied to their card.   Self-Pay (no insurance) in [facility]:  Organization         Address  Phone   Notes  Sickle Cell Patients, [facility] [place] [phone]   [facility] [place] [phone]   [facility] [place]  [place], [place], [place] [phone]   [facility]/Dr. Osei-Bonsu  [address] [place] or [address], [place] [phone] Phone number for both [place] and [place] locations is the same.  Urgent Medical and  [facility] [address], [place] [phone]   [facility] [address], [place] or [address] [phone]   [facility] [address], [place] [phone], phone; [phone], fax Sees patients 1st and 3rd Saturday of every month.  Must not qualify for public or private insurance (i.e. Medicaid, Medicare, [place]lyn [place] Health Choice, Veterans' Benefits)  Household income should be no more than 200% of the poverty level The clinic cannot treat you if you are pregnant or think you are pregnant  Sexually transmitted diseases are not treated at the clinic.    Dental Care: Organization         Address  Phone  Notes  [facility] Department of [facility] [place] [phone] Accepts children up to age 83 who are enrolled in [place] or [place]; pregnant women with a Medicaid card; and children who have applied for Medicaid or [place] Health Choice, but were declined, whose parents can pay a reduced fee at time of service.  [facility] Department of [facility]  [address], [place] [phone] Accepts children up to age 85 who are enrolled in [place] or [place]; pregnant women with a Medicaid card; and children who have applied for Medicaid or [place] Health Choice, but were declined, whose parents can pay a reduced fee at time of service.  [place] Adult Dental Access PROGRAM  [place] [phone] Patients are seen by appointment only. Walk-ins are not accepted. [place] will see patients 34 years of age and older. Monday - Tuesday (8am-5pm) Most Wednesdays (8:30-5pm) $30 per visit, cash only  [facility] Adult Dental Access PROGRAM  [address], [facility] [phone] Patients are seen by appointment only. Walk-ins are not accepted. [place] will see patients 62 years of age and  older. One Wednesday Evening (Monthly: Volunteer Based).  $30 per visit, cash only  [place]  [phone] for adults; Children under age 73, call Graduate Pediatric Dentistry at [phone]. Children aged 45-14, please call [phone] to request a pediatric application.  Dental services are provided in all areas of dental care including fillings, crowns and bridges, complete and partial dentures,  implants, gum treatment, root canals, and extractions. Preventive care is also provided. Treatment is provided to both adults and children. Patients are selected via a lottery and there is often a waiting list.   [facility] [address], [place]  [phone] [email or web address]   [facility] [address] [place], [place] [phone], Ext. [phone] Second and Fourth Thursday of each month, opens at 6:30 AM; Clinic ends at 9 AM.  Patients are seen on a first-come first-served basis, and a limited number are seen during each clinic.   [facility]  [address] Hillard Danker [place], [place] [phone]   Eligibility Requirements You must have lived in [place], [place], or [place] counties for at least the last three months.   You cannot be eligible for state or federal sponsored [employer], including [employer], [place], or [employer].   You generally cannot be eligible for healthcare insurance through your employer.    How to apply: Eligibility screenings are held every Tuesday and Wednesday afternoon from 1:00 pm until 4:00 pm. You do not need an appointment for the interview!  [facility] [address] Luciana Cammarata [place], [place]   [facility]  [place] Department  [place]  [phone]    Behavioral Health Resources in the Community: Intensive Outpatient Programs Organization          Address  Phone  Notes  [place]. [address] [place], [place] [phone]   [facility] Outpatient [address], [place], [place]   ADS: Alcohol & Drug Svcs [address], [place], [place]   [place] [address],  [place], [place] or [phone]   Substance Abuse Resources Organization         Address  Phone  Notes  Alcohol and Drug Services  [phone]   [place]  [phone]   [facility] [place]   Chinita Pester  [phone]   Residential & Outpatient Substance Abuse Program  [phone]   Psychological Services Organization         Address  Phone  Notes  [facility]  [place]  [phone]   [place] [address] [place] or [phone]    Mobile Crisis Teams Organization         Address  Phone  Notes  [facility], Mobile Crisis Care Unit  [phone]   [facility]ive Psychotherapeutic Services  [address] [place], [place]   [place] Levels [address], [place] [phone]    Self-Help/Support Groups Organization         Address  Phone             Notes  [place]. of [place] - variety of support groups  [place] Call for more information  Narcotics Anonymous (NA), [facility]aring [facility] [address], [employer] [place]  2 meetings at this location   Special educational needs teacher         Address  Phone  Notes  [facility] [place],    [place]  Post  [address] [place], [place], [place]   [place], [place] [phone] Admissions: 8am-3pm M-F  Incentives Substance  Abuse Treatment Center [address],    [place], [place] [phone]   [facility] [address] [place], [place], [place]   [facility] [address],  [place], [place]   [facility] - Intensive Outpatient [address] Kristeen [address], [place], [place]   [facility] ([place].) [place].,  [place], [place] [phone] or [phone]   [facility]idential [facility] Services (RTS) [address] [place], [place] Accepts Medicaid  [facility] [place] [address]  [place] [phone] Substance Abuse/Addiction Treatment   [facility] Organization         Address  Phone  Notes  [facility]  [phone]   Domenic Schwab, PhD [address] Arlis Porta [place], [place]   [phone] or [phone]   [place] Marty [place], [place] [phone]   [facility] [address], [place], [place] [phone] Insurance/Medicaid/sponsorship through [facility] and Families [address]                                    [place], [place] [phone] [employer] [address], [place] [phone]    Dr. Adele Schilder  [phone]   [facility] Dept. 1) [address] [place] 2) [place] 3)  [address], [place] [phone]  [phone]   [place] [phone] or [phone] (After Hours)

## 2014-12-08 NOTE — ED Notes (Signed)
Declined W/C at D/C and was escorted to lobby by RN. 

## 2014-12-26 ENCOUNTER — Encounter (HOSPITAL_COMMUNITY): Payer: Self-pay | Admitting: Obstetrics & Gynecology

## 2016-03-11 ENCOUNTER — Emergency Department (HOSPITAL_COMMUNITY)
Admission: EM | Admit: 2016-03-11 | Discharge: 2016-03-11 | Disposition: A | Discharge status: Home / Self Care | Payer: Self-pay | Attending: Emergency Medicine | Admitting: Emergency Medicine

## 2016-03-11 ENCOUNTER — Encounter (HOSPITAL_COMMUNITY): Payer: Self-pay | Admitting: Emergency Medicine

## 2016-03-11 DIAGNOSIS — M545 Low back pain, unspecified: Secondary | ICD-10-CM

## 2016-03-11 DIAGNOSIS — L0231 Cutaneous abscess of buttock: Secondary | ICD-10-CM | POA: Insufficient documentation

## 2016-03-11 DIAGNOSIS — L0291 Cutaneous abscess, unspecified: Secondary | ICD-10-CM

## 2016-03-11 DIAGNOSIS — F17210 Nicotine dependence, cigarettes, uncomplicated: Secondary | ICD-10-CM | POA: Insufficient documentation

## 2016-03-11 DIAGNOSIS — I10 Essential (primary) hypertension: Secondary | ICD-10-CM | POA: Insufficient documentation

## 2016-03-11 MED ORDER — CYCLOBENZAPRINE HCL 10 MG PO TABS: 10.0000 mg | ORAL_TABLET | Freq: Two times a day (BID) | ORAL | 0 refills | Status: DC | PRN

## 2016-03-11 MED ORDER — IBUPROFEN 800 MG PO TABS: 800.0000 mg | ORAL_TABLET | Freq: Three times a day (TID) | ORAL | 0 refills | Status: DC

## 2016-03-11 MED ORDER — DIAZEPAM 5 MG/ML IJ SOLN
5.0000 mg | Freq: Once | INTRAMUSCULAR | Status: AC
  Administered 2016-03-11: 5 mg via INTRAMUSCULAR
  Filled 2016-03-11: qty 2

## 2016-03-11 MED ORDER — DOXYCYCLINE HYCLATE 100 MG PO CAPS: 100.0000 mg | ORAL_CAPSULE | Freq: Two times a day (BID) | ORAL | 0 refills | Status: DC

## 2016-03-11 NOTE — ED Triage Notes (Signed)
Patient here with complaints of lower back pain for "months" now. Pain 10/10. Reports that sometimes she is unable to walk. Also reports abscess to left upper leg near groin.

## 2016-03-11 NOTE — Progress Notes (Signed)
Referral given to [facility] staff Stacy  Pt listed with family planning medicaid which does not cover pcp services

## 2016-03-11 NOTE — ED Provider Notes (Signed)
[facility] DEPT Provider Note   CSN: [id number] Arrival date & time: 03/11/16  1058     History   Chief Complaint Chief Complaint  Patient presents with  . Back Pain  . Abscess    HPI Allison Drake is a 35 y.o. female.  Patient presents to the emergency department with chief complaint of 2 complaints.  1. Back pain: Patient states that she has low back pain for months now. 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 She states that is worsened with lifting and twisting. She has tried taking OTC medication with no relief and heat with some relief. She states that it feels like her muscles are spasming. She denies any IV drug use. Denies history of cancer. Denies any back surgery. Denies numbness or weakness. Denies bowel or bladder incontinence.  2. Abscess: Patient reports having a boil or abscess on her bottom for the past several days. She states that it is very tender to palpation. She denies any associated fevers, chills, nausea, or vomiting. She has not tried anything for her symptoms.   The history is provided by the patient. No language interpreter was used.    Past Medical History:  Diagnosis Date  . Anemia    history  . Hypertension   . Palpitations    history -stress related  . SVD (spontaneous vaginal delivery) x 3  . Vaginal Pap smear, abnormal    LEEP    Patient Active Problem List   Diagnosis Date Noted  . Desires Sterilization 12/27/2013  . Dermoid cyst of right ovary 09/28/2013    Past Surgical History:  Procedure Laterality Date  . INDUCED ABORTION    . LAPAROSCOPY N/A 01/29/2014   Procedure: LAPAROSCOPIC RIGHT OVARIAN CYSTECTOMY, BILATERAL SALPINGECTOMY ;  Surgeon: Osborne Oman, MD;  Location: [facility];  Service: Gynecology;  Laterality: N/A;  . LEEP    . TONSILLECTOMY      OB History    Gravida Para Term Preterm AB Living   4 3 3  0 1 3   SAB TAB Ectopic Multiple Live Births   0 1 0 0 3       Home Medications     Prior to Admission medications   Medication Sig Start Date End Date Taking? Authorizing Provider  acetaminophen (TYLENOL) 500 MG tablet Take 500 mg by mouth every 6 (six) hours as needed for mild pain or moderate pain.   Yes Historical Provider, MD  cyclobenzaprine (FLEXERIL) 10 MG tablet Take 1 tablet (10 mg total) by mouth 2 (two) times daily as needed for muscle spasms. 03/11/16   Montine Circle, PA-C  docusate sodium (COLACE) 100 MG capsule Take 1 capsule (100 mg total) by mouth 2 (two) times daily as needed. Patient not taking: Reported on 03/11/2016 01/29/14   Osborne Oman, MD  doxycycline (VIBRAMYCIN) 100 MG capsule Take 1 capsule (100 mg total) by mouth 2 (two) times daily. 03/11/16   Montine Circle, PA-C  hydrochlorothiazide (HYDRODIURIL) 25 MG tablet Take 1 tablet (25 mg total) by mouth daily. Patient not taking: Reported on 03/11/2016 12/19/13   Donnamae Jude, MD  HYDROcodone-acetaminophen (NORCO/VICODIN) 5-325 MG per tablet Take 1 tablet by mouth every 4 (four) hours as needed for moderate pain or severe pain. Patient not taking: Reported on 03/11/2016 12/08/14   Clayton Bibles, PA-C  ibuprofen (ADVIL,MOTRIN) 800 MG tablet Take 1 tablet (800 mg total) by mouth 3 (three) times daily. 03/11/16   Montine Circle, PA-C  oxyCODONE-acetaminophen (PERCOCET/ROXICET) 5-325 MG per tablet Take 1-2 tablets by mouth every 6 (six) hours as needed. Patient not taking: Reported on 03/11/2016 01/29/14   Osborne Oman, MD    Family History Family History  Problem Relation Age of Onset  . Cancer Maternal Uncle   . Miscarriages / Stillbirths Maternal Grandmother   . Heart disease Maternal Grandmother   . Miscarriages / Stillbirths Maternal Grandfather   . Heart disease Maternal Grandfather   . Heart disease Paternal Grandfather   . Miscarriages / Stillbirths Paternal Grandfather     Social History Social History  Substance Use Topics  . Smoking status: Current Every Day Smoker    Packs/day: 0.50     Years: 14.00    Types: Cigarettes  . Smokeless tobacco: Never Used  . Alcohol use No     Allergies   Review of patient's allergies indicates no known allergies.   Review of Systems Review of Systems  Constitutional: Negative for chills and fever.  Gastrointestinal:       No bowel incontinence  Genitourinary:       No urinary incontinence  Musculoskeletal: Positive for arthralgias, back pain and myalgias.  Neurological:       No saddle anesthesia  All other systems reviewed and are negative.    Physical Exam Updated Vital Signs BP 122/72 (BP Location: Right Arm)   Pulse 77   Temp 98 F (36.7 C) (Oral)   Resp 18   SpO2 99%   Physical Exam Physical Exam  Constitutional: Pt appears well-developed and well-nourished. No distress.  HENT:  Head: Normocephalic and atraumatic.  Mouth/Throat: Oropharynx is clear and moist. No oropharyngeal exudate.  Eyes: Conjunctivae are normal.  Neck: Normal range of motion. Neck supple.  No meningismus Cardiovascular: Normal rate, regular rhythm and intact distal pulses.   Pulmonary/Chest: Effort normal and breath sounds normal. No respiratory distress. Pt has no wheezes.  Abdominal: Pt exhibits no distension Musculoskeletal:  Lumbar paraspinal muscles tender to palpation, no bony CTLS spine tenderness, deformity, step-off, or crepitus Lymphadenopathy: Pt has no cervical adenopathy.  Neurological: Pt is alert and oriented Speech is clear and goal oriented, follows commands Normal 5/5 strength in upper and lower extremities bilaterally including dorsiflexion and plantar flexion, strong and equal grip strength Sensation intact Great toe extension intact Moves extremities without ataxia, coordination intact Ankle and knee jerk reflexes intact and symmetrical  Normal gait Normal balance No Clonus Skin: Skin is warm and dry. No rash noted. Pt is not diaphoretic. No erythema. Chaperone present for exam of external genitalia which is  remarkable for 1x1 cm draining abscess Psychiatric: Pt has a normal mood and affect. Behavior is normal.  Nursing note and vitals reviewed.   ED Treatments / Results  Labs (all labs ordered are listed, but only abnormal results are displayed) Labs Reviewed - No data to display  EKG  EKG Interpretation None       Radiology No results found.  Procedures Procedures (including critical care time)  Medications Ordered in ED Medications  diazepam (VALIUM) injection 5 mg (5 mg Intramuscular Given 03/11/16 1202)     Initial Impression / Assessment and Plan / ED Course  I have reviewed the triage vital signs and the nursing notes.  Pertinent labs & imaging results that were available during my care of the patient were reviewed by me and considered in my medical decision making (see chart for details).  Clinical Course   INCISION AND DRAINAGE Performed by: Montine Circle Consent:  Verbal consent obtained. Risks and benefits: risks, benefits and alternatives were discussed Type: abscess  Body area: right buttock  Anesthesia: None  Already open  Local anesthetic: None  Anesthetic total: 0 ml  Complexity: simple Blunt dissection to break up loculations  Drainage: purulent  Drainage amount: moderate  Packing material: none  Patient tolerance: Patient tolerated the procedure well with no immediate complications.    Patient with back pain.  No neurological deficits and normal neuro exam.  Patient is ambulatory.  No loss of bowel or bladder control.  Doubt cauda equina.  Denies fever,  doubt epidural abscess or other lesion. Recommend back exercises, stretching, RICE, and will treat with a short course of flexeril and ibuprofen.  Encouraged the patient that there could be a need for additional workup and/or imaging such as MRI, if the symptoms do not resolve. Patient advised that if the back pain does not resolve, or radiates, this could progress to more serious  conditions and is encouraged to follow-up with PCP or orthopedics within 2 weeks.    Patient with skin abscess amenable to drainage (it was already open and draining).  Abscess was not large enough to warrant packing or drain,  wound recheck in 2 days. Encouraged home warm soaks and flushing.  Mild signs of cellulitis is surrounding skin.  Will d/c to home.  Doxycycline for home.   Final Clinical Impressions(s) / ED Diagnoses   Final diagnoses:  Bilateral low back pain without sciatica  Abscess    New Prescriptions New Prescriptions   CYCLOBENZAPRINE (FLEXERIL) 10 MG TABLET    Take 1 tablet (10 mg total) by mouth 2 (two) times daily as needed for muscle spasms.   DOXYCYCLINE (VIBRAMYCIN) 100 MG CAPSULE    Take 1 capsule (100 mg total) by mouth 2 (two) times daily.   IBUPROFEN (ADVIL,MOTRIN) 800 MG TABLET    Take 1 tablet (800 mg total) by mouth 3 (three) times daily.     Montine Circle, PA-C 03/11/16 1219    Duffy Bruce, MD 03/12/16 [phone]

## 2016-11-24 ENCOUNTER — Encounter (HOSPITAL_COMMUNITY): Payer: Self-pay | Admitting: Emergency Medicine

## 2016-11-24 ENCOUNTER — Emergency Department (HOSPITAL_COMMUNITY)
Admission: EM | Admit: 2016-11-24 | Discharge: 2016-11-24 | Disposition: A | Discharge status: Home / Self Care | Payer: Medicaid Other | Attending: Emergency Medicine | Admitting: Emergency Medicine

## 2016-11-24 DIAGNOSIS — M545 Low back pain, unspecified: Secondary | ICD-10-CM

## 2016-11-24 DIAGNOSIS — F17210 Nicotine dependence, cigarettes, uncomplicated: Secondary | ICD-10-CM | POA: Insufficient documentation

## 2016-11-24 DIAGNOSIS — I10 Essential (primary) hypertension: Secondary | ICD-10-CM | POA: Insufficient documentation

## 2016-11-24 LAB — POC URINE PREG, ED: Preg Test, Ur: NEGATIVE

## 2016-11-24 MED ORDER — METHOCARBAMOL 500 MG PO TABS: 500.0000 mg | ORAL_TABLET | Freq: Two times a day (BID) | ORAL | 0 refills | Status: DC

## 2016-11-24 MED ORDER — IBUPROFEN 600 MG PO TABS: 600.0000 mg | ORAL_TABLET | Freq: Four times a day (QID) | ORAL | 0 refills | Status: DC | PRN

## 2016-11-24 MED ORDER — IBUPROFEN 800 MG PO TABS
800.0000 mg | ORAL_TABLET | Freq: Once | ORAL | Status: AC
  Administered 2016-11-24: 800 mg via ORAL
  Filled 2016-11-24: qty 1

## 2016-11-24 NOTE — Discharge Instructions (Signed)
Take your medications as prescribed. You may also apply ice and/or heat to affected area for 15-20 minutes 3-4 times daily for additional pain relief. I recommend refraining from doing any heavy lifting, squatting or repetitive movements that exacerbate her symptoms for the next few days. °Follow-up with your primary care provider in the next week if her symptoms have not improved. °Please return to the Emergency Department if symptoms worsen or new onset of denies fever, numbness, tingling, groin anesthesia, loss of bowel or bladder, weakness, chest pain, abdominal pain, vomiting. °

## 2016-11-24 NOTE — ED Provider Notes (Signed)
[facility] DEPT Provider Note   CSN: [id number] Arrival date & time: 11/24/16  1903  By signing my name below, I, Jeanell Sparrow, attest that this documentation has been prepared under the direction and in the presence of non-physician practitioner, Harlene Ramus, PA-C. Electronically Signed: Jeanell Sparrow, Scribe. 11/24/2016. 7:32 PM.  History   Chief Complaint Chief Complaint  Patient presents with  . Back Pain   The history is provided by the patient. No language interpreter was used.   HPI Comments: Allison Drake is a 36 y.o. female who presents to the Emergency Department complaining of intermittent moderate lower back pain that started about 7 months ago. 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 Her pain worsened in the past few weeks. No treatment PTA. Her pain is worse with movement. She had her LNMP last week.  Denies any hx of cancer, hx of IV drug use, fever, chest pain, abdominal pain, vomiting, diarrhea, constipation, urinary/bladder incontinence, dysuria, hematuria, numbness, weakness, saddle paresthesia, or other complaints at this time. Denies any recent fall or injury. Denies taking any meds at home for her pain.   Past Medical History:  Diagnosis Date  . Anemia    history  . Hypertension   . Palpitations    history -stress related  . SVD (spontaneous vaginal delivery) x 3  . Vaginal Pap smear, abnormal    LEEP    Patient Active Problem List   Diagnosis Date Noted  . Desires Sterilization 12/27/2013  . Dermoid cyst of right ovary 09/28/2013    Past Surgical History:  Procedure Laterality Date  . INDUCED ABORTION    . LAPAROSCOPY N/A 01/29/2014   Procedure: LAPAROSCOPIC RIGHT OVARIAN CYSTECTOMY, BILATERAL SALPINGECTOMY ;  Surgeon: Osborne Oman, MD;  Location: [facility];  Service: Gynecology;  Laterality: N/A;  . LEEP    . TONSILLECTOMY      OB History    Gravida Para Term  Preterm AB Living   4 3 3  0 1 3   SAB TAB Ectopic Multiple Live Births   0 1 0 0 3       Home Medications    Prior to Admission medications   Medication Sig Start Date End Date Taking? Authorizing Provider  acetaminophen (TYLENOL) 500 MG tablet Take 500 mg by mouth every 6 (six) hours as needed for mild pain or moderate pain.    [provider]  cyclobenzaprine (FLEXERIL) 10 MG tablet Take 1 tablet (10 mg total) by mouth 2 (two) times daily as needed for muscle spasms. 03/11/16   Montine Circle, PA-C  docusate sodium (COLACE) 100 MG capsule Take 1 capsule (100 mg total) by mouth 2 (two) times daily as needed. Patient not taking: Reported on 03/11/2016 01/29/14   Osborne Oman, MD  doxycycline (VIBRAMYCIN) 100 MG capsule Take 1 capsule (100 mg total) by mouth 2 (two) times daily. 03/11/16   Montine Circle, PA-C  hydrochlorothiazide (HYDRODIURIL) 25 MG tablet Take 1 tablet (25 mg total) by mouth daily. Patient not taking: Reported on 03/11/2016 12/19/13   Donnamae Jude, MD  HYDROcodone-acetaminophen (NORCO/VICODIN) 5-325 MG per tablet Take 1 tablet by mouth every 4 (four) hours as needed for moderate pain or severe pain. Patient not taking: Reported on 03/11/2016 12/08/14   Clayton Bibles, PA-C  ibuprofen (ADVIL,MOTRIN) 600 MG tablet Take 1 tablet (600 mg total) by mouth every 6 (six) hours as needed. 11/24/16  Nona Dell, PA-C  methocarbamol (ROBAXIN) 500 MG tablet Take 1 tablet (500 mg total) by mouth 2 (two) times daily. 11/24/16   Nona Dell, PA-C  oxyCODONE-acetaminophen (PERCOCET/ROXICET) 5-325 MG per tablet Take 1-2 tablets by mouth every 6 (six) hours as needed. Patient not taking: Reported on 03/11/2016 01/29/14   Osborne Oman, MD    Family History Family History  Problem Relation Age of Onset  . Cancer Maternal Uncle   . Miscarriages / Stillbirths Maternal Grandmother   . Heart disease Maternal Grandmother   . Miscarriages / Stillbirths  Maternal Grandfather   . Heart disease Maternal Grandfather   . Heart disease Paternal Grandfather   . Miscarriages / Stillbirths Paternal Grandfather     Social History Social History  Substance Use Topics  . Smoking status: Current Every Day Smoker    Packs/day: 0.50    Years: 14.00    Types: Cigarettes  . Smokeless tobacco: Never Used  . Alcohol use No     Allergies   Patient has no known allergies.   Review of Systems Review of Systems  Constitutional: Negative for fever.  Cardiovascular: Negative for chest pain.  Gastrointestinal: Negative for abdominal pain and constipation.  Genitourinary: Negative for dysuria and hematuria.  Musculoskeletal: Positive for back pain (Lower).  Neurological: Negative for weakness and numbness.  All other systems reviewed and are negative.    Physical Exam Updated Vital Signs BP (!) 138/94 (BP Location: Left Arm)   Pulse 70   Temp 98.3 F (36.8 C) (Oral)   Resp 14   Ht 5\' 9"  (1.753 m)   Wt 197 lb (89.4 kg)   SpO2 100%   BMI 29.09 kg/m   Physical Exam  Constitutional: She is oriented to person, place, and time. She appears well-developed and well-nourished. No distress.  HENT:  Head: Normocephalic and atraumatic.  Eyes: Conjunctivae and EOM are normal. Right eye exhibits no discharge. Left eye exhibits no discharge. No scleral icterus.  Neck: Normal range of motion. Neck supple.  Cardiovascular: Normal rate, regular rhythm, normal heart sounds and intact distal pulses.   Pulmonary/Chest: Effort normal and breath sounds normal. No respiratory distress. She has no wheezes. She has no rales. She exhibits no tenderness.  Abdominal: Soft. Bowel sounds are normal. She exhibits no distension and no mass. There is no tenderness. There is no rebound and no guarding. No hernia.  No CVA tenderness.   Musculoskeletal: Normal range of motion. She exhibits no edema.  No midline C, T, or L tenderness. Full range of motion of neck and  back. Full range of motion of bilateral upper and lower extremities, with 5/5 strength. Sensation intact. 2+ radial and PT pulses. Cap refill <2 seconds. Patient able to stand and ambulate without assistance.   Neurological: She is alert and oriented to person, place, and time. She has normal strength. She displays normal reflexes. No sensory deficit.  Skin: Skin is warm and dry.  Psychiatric: She has a normal mood and affect.  Nursing note and vitals reviewed.    ED Treatments / Results  DIAGNOSTIC STUDIES: Oxygen Saturation is 100% on RA, normal by my interpretation.    COORDINATION OF CARE: 7:36 PM- Pt advised of plan for treatment and pt agrees.  Labs (all labs ordered are listed, but only abnormal results are displayed) Labs Reviewed  POC URINE PREG, ED    EKG  EKG Interpretation None       Radiology No results found.  Procedures Procedures (  including critical care time)  Medications Ordered in ED Medications  ibuprofen (ADVIL,MOTRIN) tablet 800 mg (not administered)     Initial Impression / Assessment and Plan / ED Course  I have reviewed the triage vital signs and the nursing notes.  Pertinent labs & imaging results that were available during my care of the patient were reviewed by me and considered in my medical decision making (see chart for details).      Patient with back pain.  No neurological deficits and normal neuro exam.  Patient can walk but states is painful.  No loss of bowel or bladder control.  No concern for cauda equina.  No fever, night sweats, weight loss, h/o cancer, IVDU.  Pregnancy negative. RICE protocol and pain medicine indicated and discussed with patient.Discussed return precautions.   Final Clinical Impressions(s) / ED Diagnoses   Final diagnoses:  Acute bilateral low back pain without sciatica    New Prescriptions New Prescriptions   IBUPROFEN (ADVIL,MOTRIN) 600 MG TABLET    Take 1 tablet (600 mg total) by mouth every 6  (six) hours as needed.   METHOCARBAMOL (ROBAXIN) 500 MG TABLET    Take 1 tablet (500 mg total) by mouth 2 (two) times daily.   I personally performed the services described in this documentation, which was scribed in my presence. The recorded information has been reviewed and is accurate.     Nona Dell, PA-C 11/24/16 Patrecia Pour    Lacretia Leigh, MD 11/25/16 [phone]

## 2016-11-24 NOTE — ED Triage Notes (Signed)
Pt c/o intermittent low back pain x 7 months. Works as Administrator, arts. No bowel or bladder incontinence, urinary symptoms, numbness or tingling. Ambulatory.

## 2017-01-11 ENCOUNTER — Encounter (HOSPITAL_COMMUNITY): Payer: Self-pay

## 2017-01-11 ENCOUNTER — Emergency Department (HOSPITAL_COMMUNITY)
Admission: EM | Admit: 2017-01-11 | Discharge: 2017-01-12 | Disposition: A | Discharge status: Home / Self Care | Payer: Medicaid Other | Attending: Emergency Medicine | Admitting: Emergency Medicine

## 2017-01-11 DIAGNOSIS — J45909 Unspecified asthma, uncomplicated: Secondary | ICD-10-CM | POA: Insufficient documentation

## 2017-01-11 DIAGNOSIS — F17210 Nicotine dependence, cigarettes, uncomplicated: Secondary | ICD-10-CM | POA: Insufficient documentation

## 2017-01-11 DIAGNOSIS — R55 Syncope and collapse: Secondary | ICD-10-CM | POA: Insufficient documentation

## 2017-01-11 DIAGNOSIS — I10 Essential (primary) hypertension: Secondary | ICD-10-CM | POA: Insufficient documentation

## 2017-01-11 LAB — CBC WITH DIFFERENTIAL/PLATELET
Basophils Absolute: 0 10*3/uL (ref 0.0–0.1)
Basophils Relative: 0 %
Eosinophils Absolute: 0.1 10*3/uL (ref 0.0–0.7)
Eosinophils Relative: 1 %
HCT: 35.3 % — ABNORMAL LOW (ref 36.0–46.0)
HEMOGLOBIN: 11.7 g/dL — AB (ref 12.0–15.0)
LYMPHS ABS: 1.1 10*3/uL (ref 0.7–4.0)
LYMPHS PCT: 8 %
MCH: 26.4 pg (ref 26.0–34.0)
MCHC: 33.1 g/dL (ref 30.0–36.0)
MCV: 79.5 fL (ref 78.0–100.0)
Monocytes Absolute: 0.5 10*3/uL (ref 0.1–1.0)
Monocytes Relative: 4 %
NEUTROS PCT: 87 %
Neutro Abs: 11.7 10*3/uL — ABNORMAL HIGH (ref 1.7–7.7)
Platelets: 198 10*3/uL (ref 150–400)
RBC: 4.44 MIL/uL (ref 3.87–5.11)
RDW: 14.3 % (ref 11.5–15.5)
WBC: 13.5 10*3/uL — ABNORMAL HIGH (ref 4.0–10.5)

## 2017-01-11 LAB — I-STAT BETA HCG BLOOD, ED (MC, WL, AP ONLY): I-stat hCG, quantitative: <5 m[IU]/mL (ref <5)

## 2017-01-11 MED ORDER — SODIUM CHLORIDE 0.9 % IV BOLUS (SEPSIS)
1000.0000 mL | Freq: Once | INTRAVENOUS | Status: AC
  Administered 2017-01-11: 1000 mL via INTRAVENOUS

## 2017-01-11 NOTE — ED Triage Notes (Signed)
To room via EMS.  Onset tonight pt was fixing sisters hair, got hot, diaphoretic, lower abd pain, syncope.  Sister caught pt as she was falling and sat her on toilet seat.

## 2017-01-11 NOTE — ED Provider Notes (Signed)
[facility] DEPT Provider Note   CSN: [id number] Arrival date & time: 01/11/17  2058     History   Chief Complaint Chief Complaint  Patient presents with  . Loss of Consciousness    HPI Allison Drake is a 36 y.o. female.  Patient with past medical history of hypertension, palpitations, presents to the emergency department with chief complaint of syncopal episode. She states that she was doing hair tonight, became hot, diaphoretic, and had some lower abdominal pain and then passed out. She did not head. She denies any injuries. She denies any pain, but states that she does feel fatigued. She denies any fever, chills, cough, nausea, vomiting, diarrhea, dysuria. She does report being on her menstrual cycle. She reports tubal ligation. She has not taken any medications for her symptoms.   The history is provided by the patient. No language interpreter was used.    Past Medical History:  Diagnosis Date  . Anemia    history  . Hypertension   . Palpitations    history -stress related  . SVD (spontaneous vaginal delivery) x 3  . Vaginal Pap smear, abnormal    LEEP    Patient Active Problem List   Diagnosis Date Noted  . Desires Sterilization 12/27/2013  . Dermoid cyst of right ovary 09/28/2013    Past Surgical History:  Procedure Laterality Date  . INDUCED ABORTION    . LAPAROSCOPY N/A 01/29/2014   Procedure: LAPAROSCOPIC RIGHT OVARIAN CYSTECTOMY, BILATERAL SALPINGECTOMY ;  Surgeon: Osborne Oman, MD;  Location: [facility];  Service: Gynecology;  Laterality: N/A;  . LEEP    . TONSILLECTOMY      OB History    Gravida Para Term Preterm AB Living   4 3 3  0 1 3   SAB TAB Ectopic Multiple Live Births   0 1 0 0 3       Home Medications    Prior to Admission medications   Medication Sig Start Date End Date Taking? Authorizing Provider  acetaminophen (TYLENOL) 500 MG tablet Take 500 mg by mouth every 6 (six) hours as needed for mild pain or moderate pain.   Yes  [provider]  cyclobenzaprine (FLEXERIL) 10 MG tablet Take 1 tablet (10 mg total) by mouth 2 (two) times daily as needed for muscle spasms. 03/11/16  Yes Montine Circle, PA-C  ibuprofen (ADVIL,MOTRIN) 600 MG tablet Take 1 tablet (600 mg total) by mouth every 6 (six) hours as needed. Patient taking differently: Take 600 mg by mouth every 6 (six) hours as needed for moderate pain.  11/24/16  Yes Nona Dell, PA-C    Family History Family History  Problem Relation Age of Onset  . Cancer Maternal Uncle   . Miscarriages / Stillbirths Maternal Grandmother   . Heart disease Maternal Grandmother   . Miscarriages / Stillbirths Maternal Grandfather   . Heart disease Maternal Grandfather   . Heart disease Paternal Grandfather   . Miscarriages / Stillbirths Paternal Grandfather     Social History Social History  Substance Use Topics  . Smoking status: Current Every Day Smoker    Packs/day: 0.50    Years: 14.00    Types: Cigarettes  . Smokeless tobacco: Never Used  . Alcohol use No     Allergies   Patient has no known allergies.   Review of Systems Review of Systems  All other systems reviewed and are negative.    Physical Exam Updated Vital Signs BP 129/74   Pulse 70  Temp 98.1 F (36.7 C) (Oral)   Resp 19   LMP 01/11/2017   SpO2 100%   Physical Exam  Constitutional: She is oriented to person, place, and time. She appears well-developed and well-nourished.  HENT:  Head: Normocephalic and atraumatic.  Eyes: Conjunctivae and EOM are normal. Pupils are equal, round, and reactive to light.  Neck: Normal range of motion. Neck supple.  Cardiovascular: Normal rate and regular rhythm.  Exam reveals no gallop and no friction rub.   No murmur heard. Pulmonary/Chest: Effort normal and breath sounds normal. No respiratory distress. She has no wheezes. She has no rales. She exhibits no tenderness.  Abdominal: Soft. Bowel sounds are normal. She exhibits no  distension and no mass. There is no tenderness. There is no rebound and no guarding.  Musculoskeletal: Normal range of motion. She exhibits no edema or tenderness.  Neurological: She is alert and oriented to person, place, and time.  Skin: Skin is warm and dry.  Psychiatric: She has a normal mood and affect. Her behavior is normal. Judgment and thought content normal.  Nursing note and vitals reviewed.    ED Treatments / Results  Labs (all labs ordered are listed, but only abnormal results are displayed) Labs Reviewed  CBC WITH DIFFERENTIAL/PLATELET  COMPREHENSIVE METABOLIC PANEL  LIPASE, BLOOD  URINALYSIS, ROUTINE W REFLEX MICROSCOPIC  I-STAT BETA HCG BLOOD, ED ([facility], [facility], [facility] ONLY)    EKG  EKG Interpretation  Date/Time:  Tuesday January 11 2017 21:00:02 EDT Ventricular Rate:  65 PR Interval:    QRS Duration: 93 QT Interval:  428 QTC Calculation: 445 R Axis:   68 Text Interpretation:  Sinus rhythm No significant change since last tracing Confirmed by Deno Etienne [phone]) on 01/11/2017 9:12:37 PM       Radiology No results found.  Procedures Procedures (including critical care time)  Medications Ordered in ED Medications  sodium chloride 0.9 % bolus 1,000 mL (not administered)     Initial Impression / Assessment and Plan / ED Course  I have reviewed the triage vital signs and the nursing notes.  Pertinent labs & imaging results that were available during my care of the patient were reviewed by me and considered in my medical decision making (see chart for details).     Patient with syncopal episode earlier this evening. She states that she had been standing most of the day doing hair. She got hot, diaphoretic, and passed out. He feels dehydrated. We'll give fluids, and will check labs.  Lab workup is reassuring. Pregnancy test negative. Orthostatic vital signs normal. No arrhythmias on EKG.  Patient felt significantly improved after fluids, and states that she is ready  to be discharge. Suspect vasovagal syncope. Recommend PCP follow-up.  Final Clinical Impressions(s) / ED Diagnoses   Final diagnoses:  Syncope, unspecified syncope type    New Prescriptions New Prescriptions   No medications on file     Miho, Monda 01/12/17 Garnette Scheuermann, MD 01/12/17 [phone]

## 2017-01-12 LAB — COMPREHENSIVE METABOLIC PANEL
ALT: 8 U/L — ABNORMAL LOW (ref 14–54)
AST: 14 U/L — ABNORMAL LOW (ref 15–41)
Albumin: 3.9 g/dL (ref 3.5–5.0)
Alkaline Phosphatase: 39 U/L (ref 38–126)
Anion gap: 4 — ABNORMAL LOW (ref 5–15)
BUN: 13 mg/dL (ref 6–20)
CALCIUM: 8.6 mg/dL — AB (ref 8.9–10.3)
CO2: 24 mmol/L (ref 22–32)
Chloride: 108 mmol/L (ref 101–111)
Creatinine, Ser: 0.68 mg/dL (ref 0.44–1.00)
GFR calc non Af Amer: >60 mL/min (ref >60)
Glucose, Bld: 92 mg/dL (ref 65–99)
Potassium: 3.8 mmol/L (ref 3.5–5.1)
SODIUM: 136 mmol/L (ref 135–145)
Total Bilirubin: 0.2 mg/dL — ABNORMAL LOW (ref 0.3–1.2)
Total Protein: 6.2 g/dL — ABNORMAL LOW (ref 6.5–8.1)

## 2017-01-12 LAB — LIPASE, BLOOD: Lipase: 22 U/L (ref 11–51)

## 2017-07-18 ENCOUNTER — Emergency Department (HOSPITAL_BASED_OUTPATIENT_CLINIC_OR_DEPARTMENT_OTHER)
Admission: EM | Admit: 2017-07-18 | Discharge: 2017-07-18 | Disposition: A | Discharge status: Home / Self Care | Payer: Self-pay | Attending: Physician Assistant | Admitting: Physician Assistant

## 2017-07-18 ENCOUNTER — Emergency Department (HOSPITAL_BASED_OUTPATIENT_CLINIC_OR_DEPARTMENT_OTHER): Payer: Self-pay

## 2017-07-18 ENCOUNTER — Encounter (HOSPITAL_BASED_OUTPATIENT_CLINIC_OR_DEPARTMENT_OTHER): Payer: Self-pay | Admitting: Emergency Medicine

## 2017-07-18 ENCOUNTER — Other Ambulatory Visit: Payer: Self-pay

## 2017-07-18 DIAGNOSIS — F17210 Nicotine dependence, cigarettes, uncomplicated: Secondary | ICD-10-CM | POA: Insufficient documentation

## 2017-07-18 DIAGNOSIS — R102 Pelvic and perineal pain: Secondary | ICD-10-CM | POA: Insufficient documentation

## 2017-07-18 DIAGNOSIS — R1030 Lower abdominal pain, unspecified: Secondary | ICD-10-CM | POA: Insufficient documentation

## 2017-07-18 DIAGNOSIS — I10 Essential (primary) hypertension: Secondary | ICD-10-CM | POA: Insufficient documentation

## 2017-07-18 HISTORY — DX: Unspecified ovarian cyst, unspecified side: N83.209

## 2017-07-18 LAB — COMPREHENSIVE METABOLIC PANEL
ALT: 9 U/L — AB (ref 14–54)
AST: 14 U/L — AB (ref 15–41)
Albumin: 4.4 g/dL (ref 3.5–5.0)
Alkaline Phosphatase: 42 U/L (ref 38–126)
Anion gap: 6 (ref 5–15)
BUN: 15 mg/dL (ref 6–20)
CHLORIDE: 106 mmol/L (ref 101–111)
CO2: 25 mmol/L (ref 22–32)
CREATININE: 0.71 mg/dL (ref 0.44–1.00)
Calcium: 9.1 mg/dL (ref 8.9–10.3)
GFR calc Af Amer: >60 mL/min (ref >60)
GFR calc non Af Amer: >60 mL/min (ref >60)
Glucose, Bld: 101 mg/dL — ABNORMAL HIGH (ref 65–99)
Potassium: 4.7 mmol/L (ref 3.5–5.1)
SODIUM: 137 mmol/L (ref 135–145)
Total Bilirubin: 0.5 mg/dL (ref 0.3–1.2)
Total Protein: 7.7 g/dL (ref 6.5–8.1)

## 2017-07-18 LAB — CBC WITH DIFFERENTIAL/PLATELET
BASOS ABS: 0 10*3/uL (ref 0.0–0.1)
Basophils Relative: 0 %
EOS ABS: 0.1 10*3/uL (ref 0.0–0.7)
EOS PCT: 1 %
HCT: 36.3 % (ref 36.0–46.0)
Hemoglobin: 12.4 g/dL (ref 12.0–15.0)
LYMPHS PCT: 4 %
Lymphs Abs: 0.7 10*3/uL (ref 0.7–4.0)
MCH: 26.8 pg (ref 26.0–34.0)
MCHC: 34.2 g/dL (ref 30.0–36.0)
MCV: 78.4 fL (ref 78.0–100.0)
Monocytes Absolute: 0.8 10*3/uL (ref 0.1–1.0)
Monocytes Relative: 5 %
NEUTROS PCT: 90 %
Neutro Abs: 14.4 10*3/uL — ABNORMAL HIGH (ref 1.7–7.7)
PLATELETS: 245 10*3/uL (ref 150–400)
RBC: 4.63 MIL/uL (ref 3.87–5.11)
RDW: 14.7 % (ref 11.5–15.5)
WBC: 16 10*3/uL — AB (ref 4.0–10.5)

## 2017-07-18 LAB — URINALYSIS, ROUTINE W REFLEX MICROSCOPIC

## 2017-07-18 LAB — WET PREP, GENITAL
SPERM: NONE SEEN
Trich, Wet Prep: NONE SEEN
Yeast Wet Prep HPF POC: NONE SEEN

## 2017-07-18 LAB — URINALYSIS, MICROSCOPIC (REFLEX)

## 2017-07-18 LAB — PREGNANCY, URINE: Preg Test, Ur: NEGATIVE

## 2017-07-18 MED ORDER — METRONIDAZOLE 500 MG PO TABS
500.0000 mg | ORAL_TABLET | Freq: Once | ORAL | Status: AC
  Administered 2017-07-18: 500 mg via ORAL
  Filled 2017-07-18: qty 1

## 2017-07-18 MED ORDER — METRONIDAZOLE 500 MG PO TABS: 500.0000 mg | ORAL_TABLET | Freq: Two times a day (BID) | ORAL | 0 refills | Status: DC

## 2017-07-18 NOTE — ED Triage Notes (Signed)
"   I was at work and my menstrual was coming on so I went home and then went back to work" Then c/o of stomach cramping and nausea . Took tylenol 500mg  2 tabs and then got hot and dizzy .  Has used 1 pad today

## 2017-07-18 NOTE — ED Provider Notes (Signed)
[facility] EMERGENCY DEPARTMENT Provider Note   CSN: [id number] Arrival date & time: 07/18/17  1449     History   Chief Complaint Chief Complaint  Patient presents with  . Menstrual Problem    HPI Allison Drake is a 36 y.o. female.  HPI   36 year old female presenting with bilateral adnexal pain, at least since Thanksgiving..  That was her last unprotected sex.  Patient reports that she had tubal ligation and since then she has been having intermittent periods that are unpredictable.  Patient not had any follow-up with a primary OB/GYN.  Patient is here to be evaluated for cramping and the abnormal periods.  Patient has history of LEEP, and removal of a cyst when she had her tubes tied.  Mild discharge or malodorous.  Denies any dyspareunia.  Past Medical History:  Diagnosis Date  . Anemia    history  . Hypertension   . Ovarian cyst   . Palpitations    history -stress related  . SVD (spontaneous vaginal delivery) x 3  . Vaginal Pap smear, abnormal    LEEP    Patient Active Problem List   Diagnosis Date Noted  . Desires Sterilization 12/27/2013  . Dermoid cyst of right ovary 09/28/2013    Past Surgical History:  Procedure Laterality Date  . INDUCED ABORTION    . LAPAROSCOPY N/A 01/29/2014   Procedure: LAPAROSCOPIC RIGHT OVARIAN CYSTECTOMY, BILATERAL SALPINGECTOMY ;  Surgeon: Osborne Oman, MD;  Location: [facility];  Service: Gynecology;  Laterality: N/A;  . LEEP    . TONSILLECTOMY    . TUBAL LIGATION      OB History    Gravida Para Term Preterm AB Living   5 3 3  0 1 3   SAB TAB Ectopic Multiple Live Births   0 1 0 0 3       Home Medications    Prior to Admission medications   Medication Sig Start Date End Date Taking? Authorizing Provider  acetaminophen (TYLENOL) 500 MG tablet Take 500 mg by mouth every 6 (six) hours as needed for mild pain or moderate pain.   Yes [provider]  cyclobenzaprine (FLEXERIL) 10 MG tablet Take 1  tablet (10 mg total) by mouth 2 (two) times daily as needed for muscle spasms. 03/11/16   Montine Circle, PA-C  ibuprofen (ADVIL,MOTRIN) 600 MG tablet Take 1 tablet (600 mg total) by mouth every 6 (six) hours as needed. Patient taking differently: Take 600 mg by mouth every 6 (six) hours as needed for moderate pain.  11/24/16   Nona Dell, PA-C    Family History Family History  Problem Relation Age of Onset  . Cancer Maternal Uncle   . Miscarriages / Stillbirths Maternal Grandmother   . Heart disease Maternal Grandmother   . Miscarriages / Stillbirths Maternal Grandfather   . Heart disease Maternal Grandfather   . Heart disease Paternal Grandfather   . Miscarriages / Stillbirths Paternal Grandfather     Social History Social History   Tobacco Use  . Smoking status: Current Every Day Smoker    Packs/day: 0.50    Years: 14.00    Pack years: 7.00    Types: Cigarettes  . Smokeless tobacco: Never Used  Substance Use Topics  . Alcohol use: No  . Drug use: No     Allergies   Patient has no known allergies.   Review of Systems Review of Systems  Constitutional: Negative for activity change and fever.  Respiratory: Negative  for shortness of breath.   Cardiovascular: Negative for chest pain.  Gastrointestinal: Negative for abdominal pain.  Genitourinary: Positive for menstrual problem and pelvic pain. Negative for dyspareunia and vaginal discharge.     Physical Exam Updated Vital Signs BP 108/72 (BP Location: Left Arm)   Pulse 68   Temp 98.2 F (36.8 C) (Oral)   Resp 18   Ht 5\' 10"  (1.778 m)   Wt 89.4 kg (197 lb)   LMP 07/18/2017   SpO2 100%   BMI 28.27 kg/m   Physical Exam  Constitutional: She is oriented to person, place, and time. She appears well-developed and well-nourished.  HENT:  Head: Normocephalic and atraumatic.  Eyes: Right eye exhibits no discharge. Left eye exhibits no discharge.  Cardiovascular: Normal rate, regular rhythm and normal  heart sounds.  No murmur heard. Pulmonary/Chest: Effort normal and breath sounds normal. She has no wheezes. She has no rales.  Abdominal: Soft. She exhibits no distension. There is tenderness.  The most mild adnexal tenderness more in the left than right.  Genitourinary: Vagina normal.  Genitourinary Comments: No CMT.  Cervix is mildly irregular, consistent with prior LEEP.  Neurological: She is oriented to person, place, and time.  Skin: Skin is warm and dry. She is not diaphoretic.  Psychiatric: She has a normal mood and affect.  Nursing note and vitals reviewed.    ED Treatments / Results  Labs (all labs ordered are listed, but only abnormal results are displayed) Labs Reviewed  URINALYSIS, ROUTINE W REFLEX MICROSCOPIC - Abnormal; Notable for the following components:      Result Value   Color, Urine RED (*)    APPearance TURBID (*)    Glucose, UA   (*)    Value: TEST NOT REPORTED DUE TO COLOR INTERFERENCE OF URINE PIGMENT   Hgb urine dipstick   (*)    Value: TEST NOT REPORTED DUE TO COLOR INTERFERENCE OF URINE PIGMENT   Bilirubin Urine   (*)    Value: TEST NOT REPORTED DUE TO COLOR INTERFERENCE OF URINE PIGMENT   Ketones, ur   (*)    Value: TEST NOT REPORTED DUE TO COLOR INTERFERENCE OF URINE PIGMENT   Protein, ur   (*)    Value: TEST NOT REPORTED DUE TO COLOR INTERFERENCE OF URINE PIGMENT   Nitrite   (*)    Value: TEST NOT REPORTED DUE TO COLOR INTERFERENCE OF URINE PIGMENT   Leukocytes, UA   (*)    Value: TEST NOT REPORTED DUE TO COLOR INTERFERENCE OF URINE PIGMENT   All other components within normal limits  URINALYSIS, MICROSCOPIC (REFLEX) - Abnormal; Notable for the following components:   Bacteria, UA FEW (*)    Squamous Epithelial / LPF 0-5 (*)    All other components within normal limits  CBC WITH DIFFERENTIAL/PLATELET - Abnormal; Notable for the following components:   WBC 16.0 (*)    Neutro Abs 14.4 (*)    All other components within normal limits    COMPREHENSIVE METABOLIC PANEL - Abnormal; Notable for the following components:   Glucose, Bld 101 (*)    AST 14 (*)    ALT 9 (*)    All other components within normal limits  WET PREP, GENITAL  PREGNANCY, URINE  GC/CHLAMYDIA PROBE AMP ([place]) NOT AT [facility]    EKG  EKG Interpretation None       Radiology No results found.  Procedures Procedures (including critical care time)  Medications Ordered in ED Medications - No  data to display   Initial Impression / Assessment and Plan / ED Course  I have reviewed the triage vital signs and the nursing notes.  Pertinent labs & imaging results that were available during my care of the patient were reviewed by me and considered in my medical decision making (see chart for details).     36 year old female presenting with bilateral adnexal pain, at least since Thanksgiving..  That was her last unprotected sex.  Patient reports that she had tubal ligation and since then she has been having intermittent periods that are unpredictable.  Patient not had any follow-up with a primary OB/GYN.  Patient is here to be evaluated for cramping and the abnormal periods.  Patient has history of LEEP, and removal of a cyst when she had her tubes tied.  Mild discharge or malodorous.  Denies any dyspareunia.  6:18 PM   Will get l ultrasound given the fact she has abnormal Pap smears, LEEP and cyst removal.  Will do a vaginal exam to look for STI's.  Vaginal exam normal.  Showed clue cells.  Likely the cause of her low-grade pain.  Ultrasound was normal and reassuring.  We will have her follow-up with primary OB/GYN peer   Final Clinical Impressions(s) / ED Diagnoses   Final diagnoses:  None    ED Discharge Orders    None       Macarthur Critchley, MD 07/19/17 2206

## 2017-07-18 NOTE — ED Notes (Signed)
States," I am feeling better' Gait steady

## 2017-07-18 NOTE — ED Notes (Signed)
Pt discharged to home with family. NAD.  

## 2017-07-18 NOTE — Discharge Instructions (Signed)
You were seen today with lower abdominal pain.  We think is likely from a bacterial vaginosis infection.  Please use the medication as prescribed.  Please follow-up with an OB/GYN for your dysfunctional uterine bleeding.  Given you some reading about this.

## 2017-07-20 LAB — GC/CHLAMYDIA PROBE AMP (~~LOC~~) NOT AT ARMC
Chlamydia: NEGATIVE
NEISSERIA GONORRHEA: NEGATIVE

## 2017-09-04 ENCOUNTER — Encounter (HOSPITAL_BASED_OUTPATIENT_CLINIC_OR_DEPARTMENT_OTHER): Payer: Self-pay | Admitting: Emergency Medicine

## 2017-09-04 ENCOUNTER — Emergency Department (HOSPITAL_BASED_OUTPATIENT_CLINIC_OR_DEPARTMENT_OTHER)
Admission: EM | Admit: 2017-09-04 | Discharge: 2017-09-04 | Disposition: A | Discharge status: Home / Self Care | Payer: Self-pay | Attending: Emergency Medicine | Admitting: Emergency Medicine

## 2017-09-04 ENCOUNTER — Other Ambulatory Visit: Payer: Self-pay

## 2017-09-04 ENCOUNTER — Emergency Department (HOSPITAL_BASED_OUTPATIENT_CLINIC_OR_DEPARTMENT_OTHER): Payer: Self-pay

## 2017-09-04 DIAGNOSIS — R69 Illness, unspecified: Secondary | ICD-10-CM

## 2017-09-04 DIAGNOSIS — J111 Influenza due to unidentified influenza virus with other respiratory manifestations: Secondary | ICD-10-CM | POA: Insufficient documentation

## 2017-09-04 DIAGNOSIS — I10 Essential (primary) hypertension: Secondary | ICD-10-CM | POA: Insufficient documentation

## 2017-09-04 DIAGNOSIS — F17210 Nicotine dependence, cigarettes, uncomplicated: Secondary | ICD-10-CM | POA: Insufficient documentation

## 2017-09-04 NOTE — ED Triage Notes (Signed)
patient states that she has had generalized chills aches and a cough x 2 -3 days. Reports a Headache as well. Took Motrin this am at 0900

## 2017-09-04 NOTE — Discharge Instructions (Signed)
Please read and follow all provided instructions.  Your diagnoses today include:  1. Influenza-like illness    Tests performed today include:  Chest x-ray - no pneumonia  Vital signs. See below for your results today.   Medications prescribed:   None  Take any prescribed medications only as directed.  Home care instructions:  Follow any educational materials contained in this packet. Please continue drinking plenty of fluids. Use over-the-counter cold and flu medications as needed as directed on packaging for symptom relief. You may also use ibuprofen or tylenol as directed on packaging for pain or fever.   BE VERY CAREFUL not to take multiple medicines containing Tylenol (also called acetaminophen). Doing so can lead to an overdose which can damage your liver and cause liver failure and possibly death.   Follow-up instructions: Please follow-up with your primary care provider in the next 3 days for further evaluation of your symptoms.   Return instructions:   Please return to the Emergency Department if you experience worsening symptoms.  Please return if you have a high fever greater than 101 degrees not controlled with over-the-counter medications, persistent vomiting and cannot keep down fluids, or worsening trouble breathing.  Please return if you have any other emergent concerns.  Additional Information:  Your vital signs today were: BP 128/87 (BP Location: Left Arm)    Pulse (!) 102    Temp 99.9 F (37.7 C) (Oral)    Resp (!) 22    Ht 5\' 10"  (1.778 m)    Wt 86.2 kg (190 lb)    LMP 07/18/2017    SpO2 99%    Breastfeeding? Unknown    BMI 27.26 kg/m  If your blood pressure (BP) was elevated above 135/85 this visit, please have this repeated by your doctor within one month.

## 2017-09-04 NOTE — ED Provider Notes (Signed)
[facility] EMERGENCY DEPARTMENT Provider Note   CSN: [id number] Arrival date & time: 09/04/17  1105     History   Chief Complaint Chief Complaint  Patient presents with  . Cough    HPI Allison Drake is a 37 y.o. female.  Patient presents with complaint of abrupt onset of fever, chills, cough, body aches, pain behind her eyes starting yesterday morning.  Symptoms persisted today.  She took ibuprofen 800 mg this morning prior to arrival.  No ear pain, sore throat.  No chest pain or shortness of breath.  Cough is nonproductive.  No abdominal pain, nausea, vomiting, diarrhea.  No urinary symptoms or skin rash.  No treatments prior to arrival.  No known sick contacts.  Patient did not have a flu shot this year.  The onset of this condition was acute. The course is constant. Aggravating factors: none. Alleviating factors: none.        Past Medical History:  Diagnosis Date  . Anemia    history  . Hypertension   . Ovarian cyst   . Palpitations    history -stress related  . SVD (spontaneous vaginal delivery) x 3  . Vaginal Pap smear, abnormal    LEEP    Patient Active Problem List   Diagnosis Date Noted  . Desires Sterilization 12/27/2013  . Dermoid cyst of right ovary 09/28/2013    Past Surgical History:  Procedure Laterality Date  . INDUCED ABORTION    . LAPAROSCOPY N/A 01/29/2014   Procedure: LAPAROSCOPIC RIGHT OVARIAN CYSTECTOMY, BILATERAL SALPINGECTOMY ;  Surgeon: Osborne Oman, MD;  Location: [facility];  Service: Gynecology;  Laterality: N/A;  . LEEP    . TONSILLECTOMY    . TUBAL LIGATION      OB History    Gravida Para Term Preterm AB Living   5 3 3  0 1 3   SAB TAB Ectopic Multiple Live Births   0 1 0 0 3       Home Medications    Prior to Admission medications   Medication Sig Start Date End Date Taking? Authorizing Provider  acetaminophen (TYLENOL) 500 MG tablet Take 500 mg by mouth every 6 (six) hours as needed for mild pain or moderate  pain.    [provider]  cyclobenzaprine (FLEXERIL) 10 MG tablet Take 1 tablet (10 mg total) by mouth 2 (two) times daily as needed for muscle spasms. 03/11/16   Montine Circle, PA-C  ibuprofen (ADVIL,MOTRIN) 600 MG tablet Take 1 tablet (600 mg total) by mouth every 6 (six) hours as needed. Patient taking differently: Take 600 mg by mouth every 6 (six) hours as needed for moderate pain.  11/24/16   Nona Dell, PA-C  metroNIDAZOLE (FLAGYL) 500 MG tablet Take 1 tablet (500 mg total) by mouth 2 (two) times daily. 07/18/17   Mackuen, Fredia Sorrow, MD    Family History Family History  Problem Relation Age of Onset  . Cancer Maternal Uncle   . Miscarriages / Stillbirths Maternal Grandmother   . Heart disease Maternal Grandmother   . Miscarriages / Stillbirths Maternal Grandfather   . Heart disease Maternal Grandfather   . Heart disease Paternal Grandfather   . Miscarriages / Stillbirths Paternal Grandfather     Social History Social History   Tobacco Use  . Smoking status: Current Every Day Smoker    Packs/day: 0.50    Years: 14.00    Pack years: 7.00    Types: Cigarettes  . Smokeless tobacco: Never  Used  Substance Use Topics  . Alcohol use: No  . Drug use: No     Allergies   Patient has no known allergies.   Review of Systems Review of Systems  Constitutional: Positive for chills, fatigue and fever.  HENT: Positive for congestion. Negative for ear pain, rhinorrhea, sinus pressure and sore throat.   Eyes: Positive for pain. Negative for redness.  Respiratory: Positive for cough. Negative for shortness of breath and wheezing.   Cardiovascular: Negative for chest pain.  Gastrointestinal: Negative for abdominal pain, diarrhea, nausea and vomiting.  Genitourinary: Negative for dysuria.  Musculoskeletal: Positive for myalgias. Negative for neck stiffness.  Skin: Negative for rash.  Neurological: Positive for headaches.  Hematological: Negative for  adenopathy.     Physical Exam Updated Vital Signs BP 128/87 (BP Location: Left Arm)   Pulse (!) 102   Temp 99.9 F (37.7 C) (Oral)   Resp (!) 22   Ht 5\' 10"  (1.778 m)   Wt 86.2 kg (190 lb)   LMP 07/18/2017   SpO2 99%   Breastfeeding? Unknown   BMI 27.26 kg/m   Physical Exam  Constitutional: She appears well-developed and well-nourished.  HENT:  Head: Normocephalic and atraumatic.  Right Ear: Tympanic membrane, external ear and ear canal normal.  Left Ear: Tympanic membrane, external ear and ear canal normal.  Nose: Mucosal edema and rhinorrhea present.  Mouth/Throat: Mucous membranes are normal. Posterior oropharyngeal erythema present. No oropharyngeal exudate, posterior oropharyngeal edema or tonsillar abscesses.  Eyes: Conjunctivae are normal. Right eye exhibits no discharge. Left eye exhibits no discharge.  Neck: Normal range of motion. Neck supple.  Cardiovascular: Normal rate, regular rhythm and normal heart sounds.  No murmur heard. Pulmonary/Chest: Effort normal and breath sounds normal. No stridor. No respiratory distress. She has no wheezes.  Abdominal: Soft. There is no tenderness. There is no rebound and no guarding.  Neurological: She is alert.  Skin: Skin is warm and dry.  Psychiatric: She has a normal mood and affect.  Nursing note and vitals reviewed.    ED Treatments / Results  Labs (all labs ordered are listed, but only abnormal results are displayed) Labs Reviewed - No data to display  EKG  EKG Interpretation None       Radiology Dg Chest 2 View  Result Date: 09/04/2017 CLINICAL DATA:  Cough, fever and chills 1 day.  Smoker. EXAM: CHEST  2 VIEW COMPARISON:  None. FINDINGS: The heart size and mediastinal contours are within normal limits. Both lungs are clear. The visualized skeletal structures are unremarkable. IMPRESSION: No active cardiopulmonary disease. Electronically Signed   By: Marin Olp M.D.   On: 09/04/2017 11:49     Procedures Procedures (including critical care time)  Medications Ordered in ED Medications - No data to display   Initial Impression / Assessment and Plan / ED Course  I have reviewed the triage vital signs and the nursing notes.  Pertinent labs & imaging results that were available during my care of the patient were reviewed by me and considered in my medical decision making (see chart for details).     Patient seen and examined.  Discussed results of chest x-ray.  Vital signs reviewed and are as follows: BP 108/71 (BP Location: Left Arm)   Pulse (!) 102   Temp 99.2 F (37.3 C) (Oral)   Resp (!) 22   Ht 5\' 10"  (1.778 m)   Wt 86.2 kg (190 lb)   LMP 07/18/2017   SpO2 97%  Breastfeeding? Unknown   BMI 27.26 kg/m   1:23 PM Patient discharged to home. Encouraged to rest and drink plenty of fluids.  Patient told to return to ED or see their primary doctor if their symptoms worsen, high fever not controlled with tylenol, persistent vomiting, they feel they are dehydrated, or if they have any other concerns.  Patient verbalized understanding and agreed with plan.     Final Clinical Impressions(s) / ED Diagnoses   Final diagnoses:  Influenza-like illness   Patient with symptoms consistent with influenza.  Chest x-ray ordered at triage is clear.  Vitals are stable, low-grade temperature. No signs of dehydration, tolerating PO's. Lungs are clear. Supportive therapy indicated with return if symptoms worsen. Patient counseled.   ED Discharge Orders    None       Carlisle Cater, PA-C 09/04/17 1324    Little, Wenda Overland, MD 09/05/17 1550

## 2018-03-12 IMAGING — CR DG CHEST 2V
2 series · 2 of 2 positions shown · non-contrast
Comparison: None.

CLINICAL DATA: Cough, fever and chills 1 day.  Smoker.

EXAM:
CHEST  2 VIEW

[w chest pa]
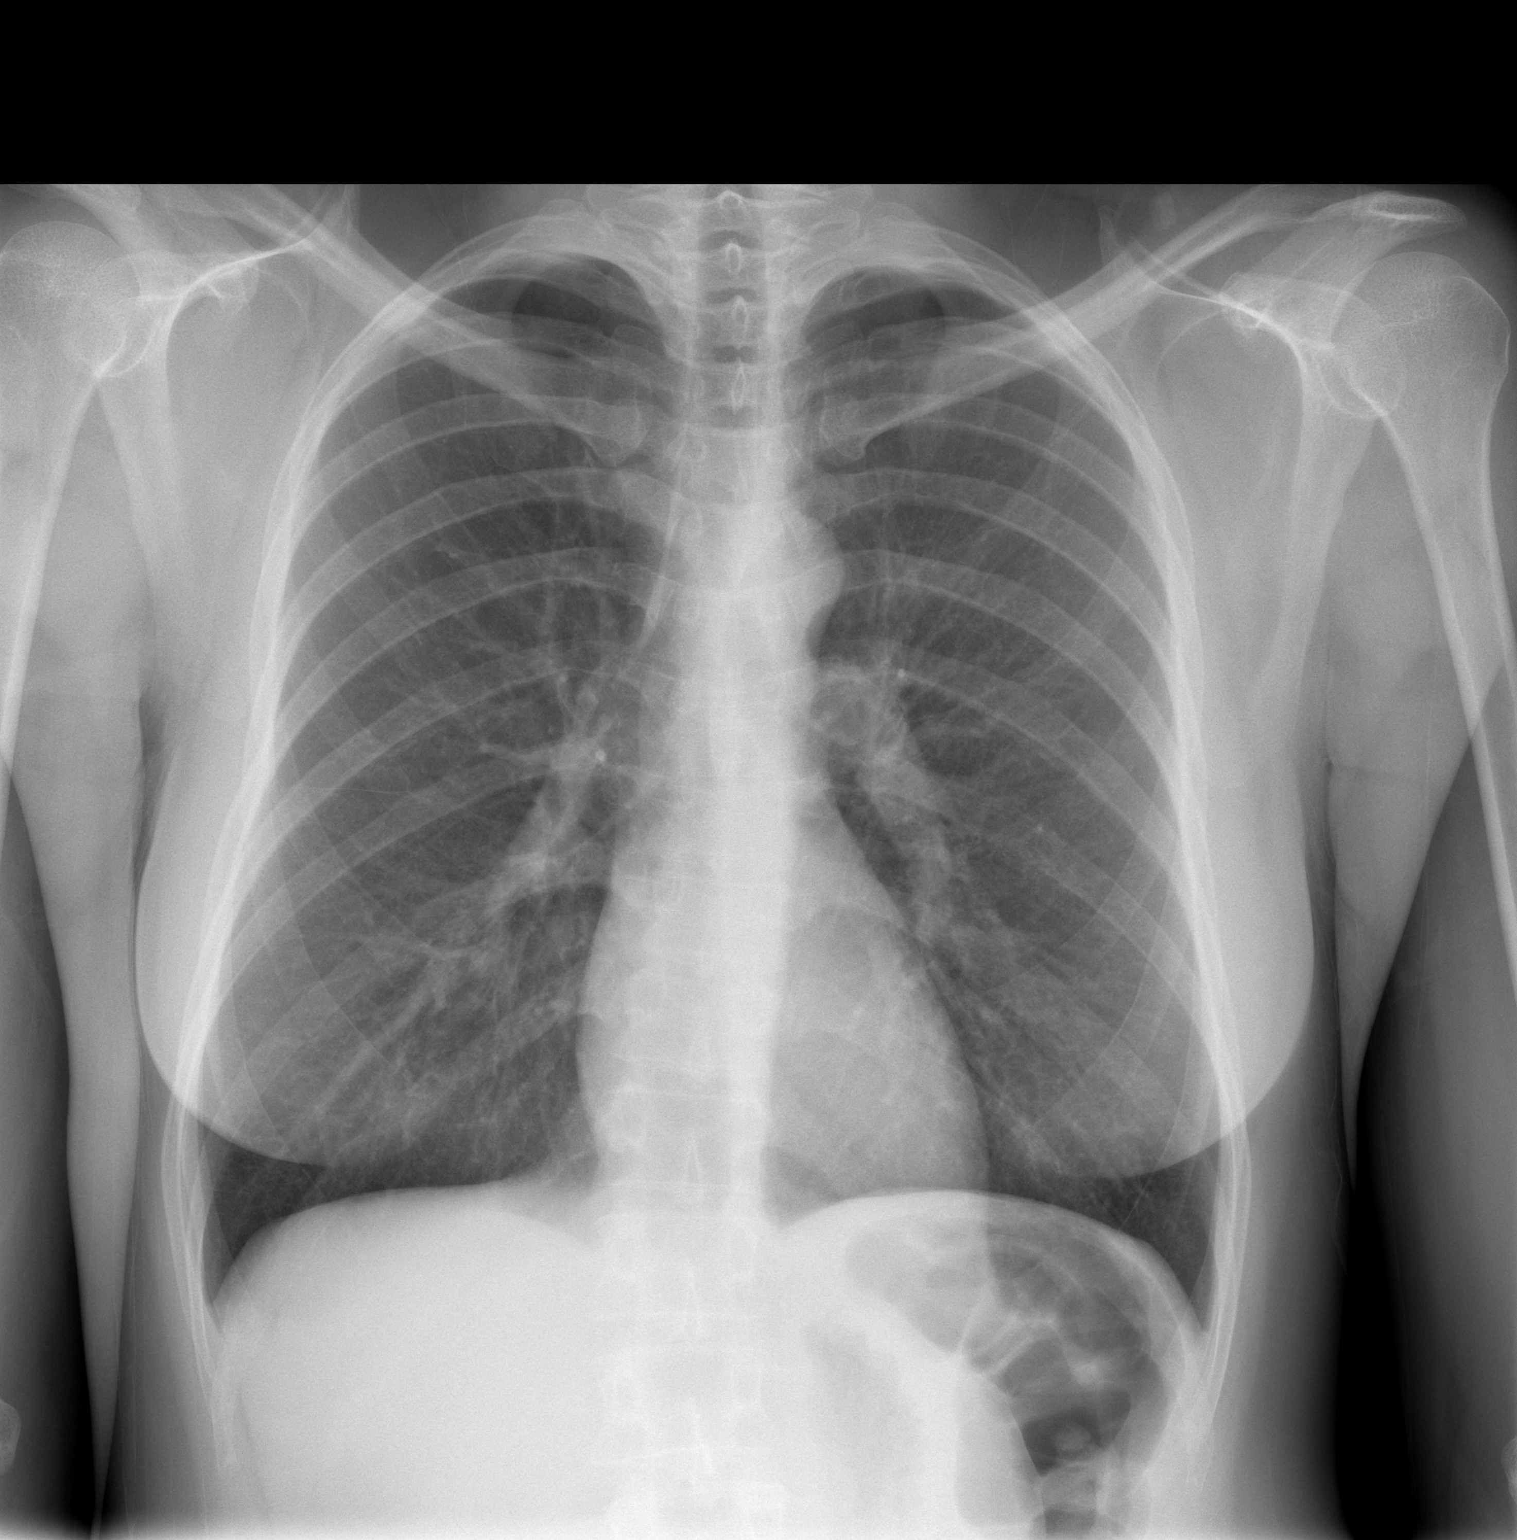

[w chest lat]
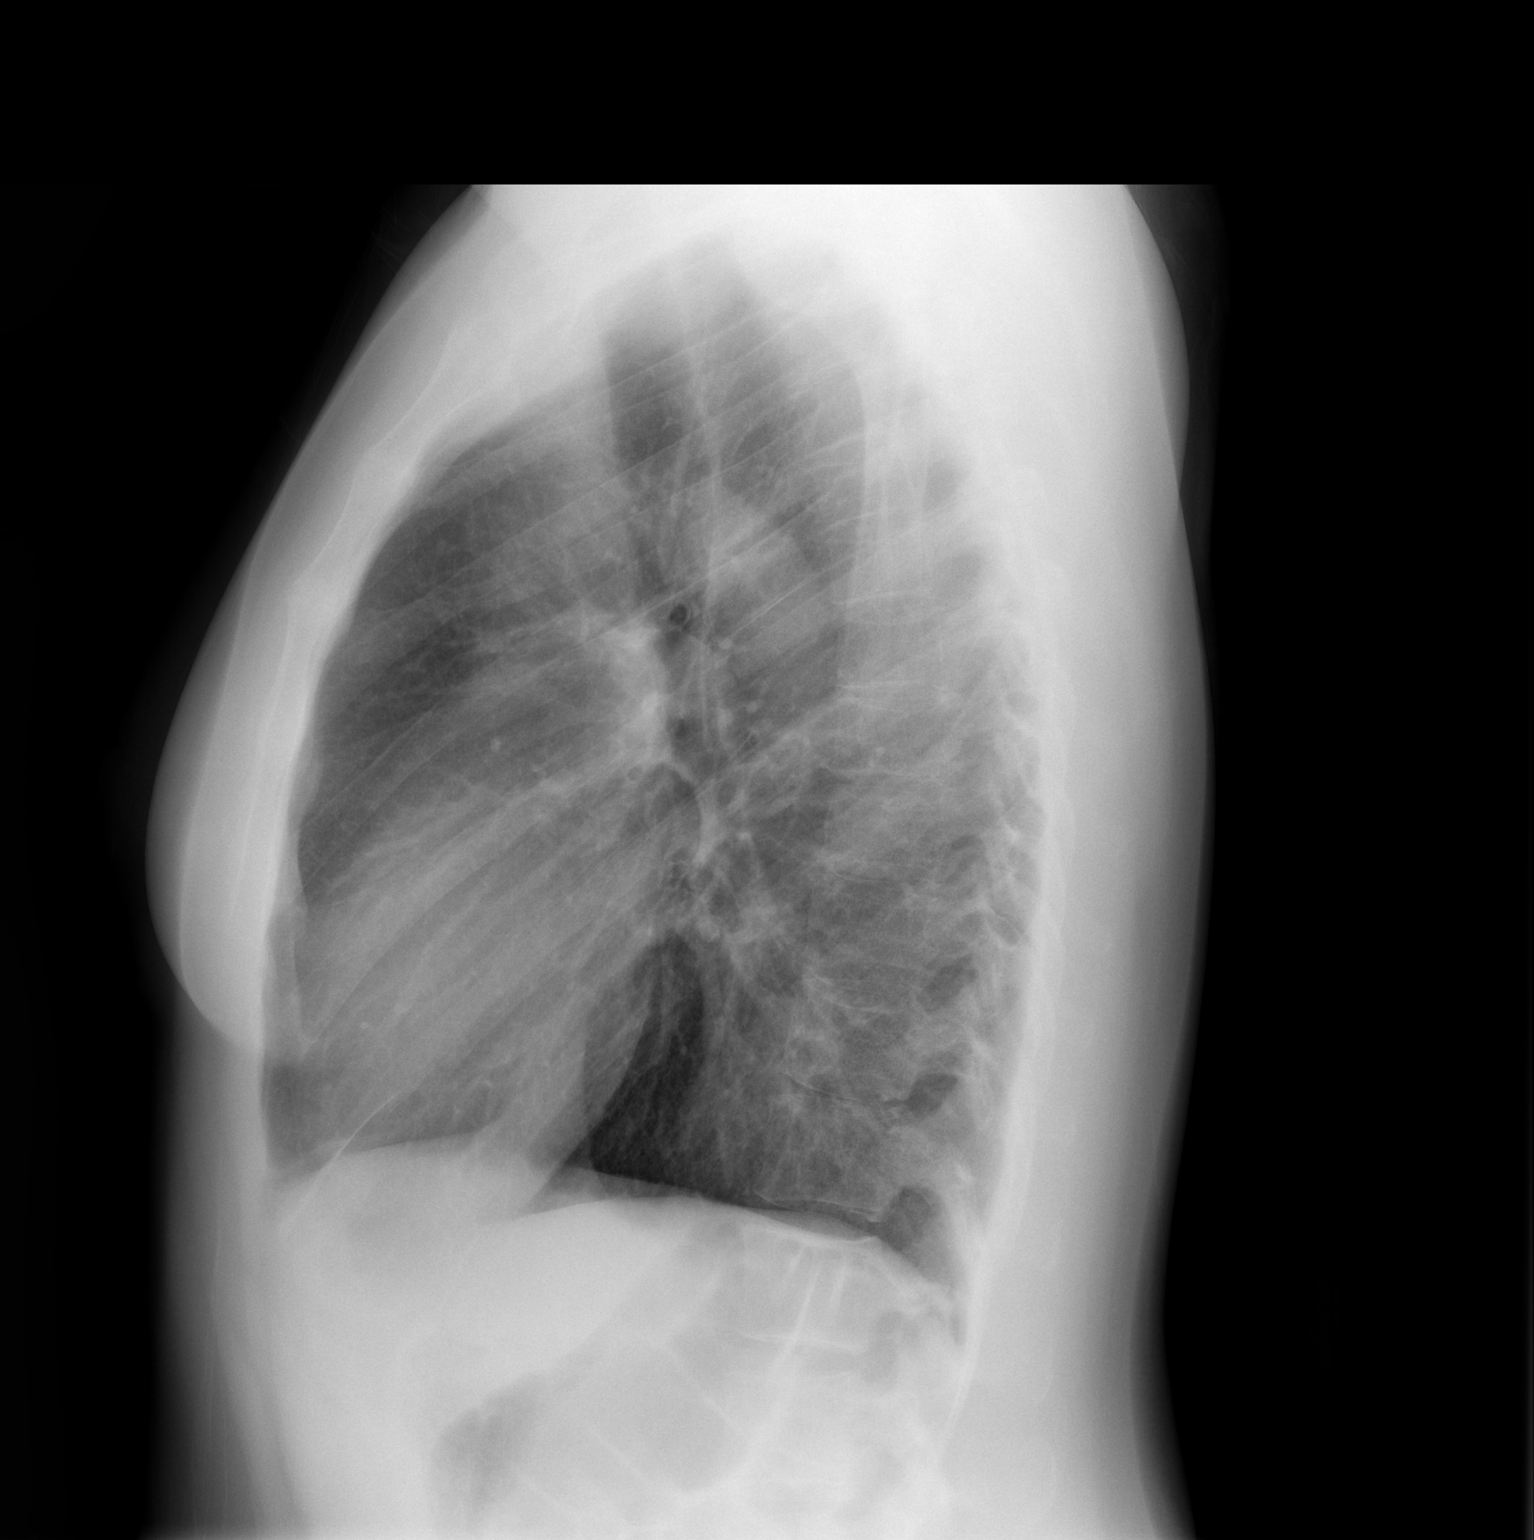

[2 of 2 positions shown; findings below may reference images not displayed]

FINDINGS: The heart size and mediastinal contours are within normal limits.
Both lungs are clear. The visualized skeletal structures are
unremarkable.
IMPRESSION: No active cardiopulmonary disease.

## 2018-07-28 ENCOUNTER — Emergency Department (HOSPITAL_COMMUNITY)
Admission: EM | Admit: 2018-07-28 | Discharge: 2018-07-28 | Disposition: A | Discharge status: Home / Self Care | Payer: No Typology Code available for payment source | Attending: Emergency Medicine | Admitting: Emergency Medicine

## 2018-07-28 ENCOUNTER — Other Ambulatory Visit: Payer: Self-pay

## 2018-07-28 ENCOUNTER — Encounter (HOSPITAL_COMMUNITY): Payer: Self-pay | Admitting: Emergency Medicine

## 2018-07-28 DIAGNOSIS — F17210 Nicotine dependence, cigarettes, uncomplicated: Secondary | ICD-10-CM | POA: Diagnosis not present

## 2018-07-28 DIAGNOSIS — Y92410 Unspecified street and highway as the place of occurrence of the external cause: Secondary | ICD-10-CM | POA: Insufficient documentation

## 2018-07-28 DIAGNOSIS — Y9389 Activity, other specified: Secondary | ICD-10-CM | POA: Diagnosis not present

## 2018-07-28 DIAGNOSIS — S29012A Strain of muscle and tendon of back wall of thorax, initial encounter: Secondary | ICD-10-CM | POA: Insufficient documentation

## 2018-07-28 DIAGNOSIS — Y999 Unspecified external cause status: Secondary | ICD-10-CM | POA: Diagnosis not present

## 2018-07-28 DIAGNOSIS — I10 Essential (primary) hypertension: Secondary | ICD-10-CM | POA: Diagnosis not present

## 2018-07-28 DIAGNOSIS — T148XXA Other injury of unspecified body region, initial encounter: Secondary | ICD-10-CM

## 2018-07-28 DIAGNOSIS — M542 Cervicalgia: Secondary | ICD-10-CM | POA: Diagnosis not present

## 2018-07-28 DIAGNOSIS — S299XXA Unspecified injury of thorax, initial encounter: Secondary | ICD-10-CM | POA: Diagnosis present

## 2018-07-28 MED ORDER — METHOCARBAMOL 500 MG PO TABS: 500.0000 mg | ORAL_TABLET | Freq: Two times a day (BID) | ORAL | 0 refills | Status: AC

## 2018-07-28 MED ORDER — IBUPROFEN 600 MG PO TABS: 600.0000 mg | ORAL_TABLET | Freq: Four times a day (QID) | ORAL | 0 refills | Status: AC | PRN

## 2018-07-28 NOTE — Discharge Instructions (Signed)
Return if any problems.
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
## 2019-08-13 ENCOUNTER — Emergency Department (HOSPITAL_BASED_OUTPATIENT_CLINIC_OR_DEPARTMENT_OTHER): Payer: Self-pay

## 2019-08-13 ENCOUNTER — Other Ambulatory Visit: Payer: Self-pay

## 2019-08-13 ENCOUNTER — Encounter (HOSPITAL_BASED_OUTPATIENT_CLINIC_OR_DEPARTMENT_OTHER): Payer: Self-pay

## 2019-08-13 ENCOUNTER — Emergency Department (HOSPITAL_BASED_OUTPATIENT_CLINIC_OR_DEPARTMENT_OTHER)
Admission: EM | Admit: 2019-08-13 | Discharge: 2019-08-13 | Disposition: A | Discharge status: Home / Self Care | Payer: Self-pay | Attending: Emergency Medicine | Admitting: Emergency Medicine

## 2019-08-13 DIAGNOSIS — I10 Essential (primary) hypertension: Secondary | ICD-10-CM | POA: Insufficient documentation

## 2019-08-13 DIAGNOSIS — R1030 Lower abdominal pain, unspecified: Secondary | ICD-10-CM

## 2019-08-13 DIAGNOSIS — F17210 Nicotine dependence, cigarettes, uncomplicated: Secondary | ICD-10-CM | POA: Insufficient documentation

## 2019-08-13 DIAGNOSIS — N3001 Acute cystitis with hematuria: Secondary | ICD-10-CM

## 2019-08-13 LAB — COMPREHENSIVE METABOLIC PANEL
ALT: 9 U/L (ref 0–44)
AST: 10 U/L — ABNORMAL LOW (ref 15–41)
Albumin: 4.3 g/dL (ref 3.5–5.0)
Alkaline Phosphatase: 46 U/L (ref 38–126)
Anion gap: 6 (ref 5–15)
BUN: 9 mg/dL (ref 6–20)
CO2: 25 mmol/L (ref 22–32)
Calcium: 9.1 mg/dL (ref 8.9–10.3)
Chloride: 105 mmol/L (ref 98–111)
Creatinine, Ser: 0.76 mg/dL (ref 0.44–1.00)
GFR calc Af Amer: >60 mL/min (ref >60)
GFR calc non Af Amer: >60 mL/min (ref >60)
Glucose, Bld: 114 mg/dL — ABNORMAL HIGH (ref 70–99)
Potassium: 3.4 mmol/L — ABNORMAL LOW (ref 3.5–5.1)
Sodium: 136 mmol/L (ref 135–145)
Total Bilirubin: 1 mg/dL (ref 0.3–1.2)
Total Protein: 8.2 g/dL — ABNORMAL HIGH (ref 6.5–8.1)

## 2019-08-13 LAB — CBC
HCT: 37.1 % (ref 36.0–46.0)
Hemoglobin: 12.2 g/dL (ref 12.0–15.0)
MCH: 26.3 pg (ref 26.0–34.0)
MCHC: 32.9 g/dL (ref 30.0–36.0)
MCV: 80.1 fL (ref 80.0–100.0)
Platelets: 225 10*3/uL (ref 150–400)
RBC: 4.63 MIL/uL (ref 3.87–5.11)
RDW: 14 % (ref 11.5–15.5)
WBC: 9.7 10*3/uL (ref 4.0–10.5)
nRBC: 0 % (ref 0.0–0.2)

## 2019-08-13 LAB — URINALYSIS, MICROSCOPIC (REFLEX): WBC, UA: >50 WBC/hpf (ref 0–5)

## 2019-08-13 LAB — URINALYSIS, ROUTINE W REFLEX MICROSCOPIC
Bilirubin Urine: NEGATIVE
Glucose, UA: NEGATIVE mg/dL
Ketones, ur: 15 mg/dL — AB
Nitrite: POSITIVE — AB
Protein, ur: 30 mg/dL — AB
Specific Gravity, Urine: 1.02 (ref 1.005–1.030)
pH: 5.5 (ref 5.0–8.0)

## 2019-08-13 LAB — PREGNANCY, URINE: Preg Test, Ur: NEGATIVE

## 2019-08-13 LAB — LIPASE, BLOOD: Lipase: 19 U/L (ref 11–51)

## 2019-08-13 MED ORDER — CEPHALEXIN 500 MG PO CAPS: 500.0000 mg | ORAL_CAPSULE | Freq: Two times a day (BID) | ORAL | 0 refills | Status: AC

## 2019-08-13 MED ORDER — IOHEXOL 300 MG/ML  SOLN
100.0000 mL | Freq: Once | INTRAMUSCULAR | Status: AC | PRN
  Administered 2019-08-13: 100 mL via INTRAVENOUS

## 2019-08-13 MED ORDER — FENTANYL CITRATE (PF) 100 MCG/2ML IJ SOLN
50.0000 ug | Freq: Once | INTRAMUSCULAR | Status: AC
  Administered 2019-08-13: 50 ug via INTRAVENOUS
  Filled 2019-08-13: qty 2

## 2019-08-13 MED ORDER — SODIUM CHLORIDE 0.9% FLUSH
3.0000 mL | Freq: Once | INTRAVENOUS | Status: DC
  Filled 2019-08-13: qty 3

## 2019-08-13 MED ORDER — ONDANSETRON HCL 4 MG/2ML IJ SOLN
4.0000 mg | Freq: Once | INTRAMUSCULAR | Status: AC
  Administered 2019-08-13: 4 mg via INTRAVENOUS
  Filled 2019-08-13: qty 2

## 2019-08-13 MED FILL — CEPHALEXIN 500 MG CAPSULE: 500 | 7 days supply | Qty: 14 | Fill #0

## 2019-08-13 NOTE — ED Provider Notes (Signed)
[facility] EMERGENCY DEPARTMENT Provider Note   CSN: [id number] Arrival date & time: 08/13/19  1458     History Chief Complaint  Patient presents with  . Abdominal Pain    Allison Drake is a 39 y.o. female.  39 y.o female with a PMH of Anemia, HTN, presents to the ED with a chief complaint of abdominal pain x 2 weeks. Patient describes an intermittent cramping to her left lower quadrant reports "feels like contractions".  This has been worsening over the last several days.  She reports her last bowel movement was approximately 4 days ago, reports she usually has a normal bowel movement in 4 days span.  She is try taking some Aleve which helps some with her pain.  She also endorses vomiting which began yesterday, reports she felt very nauseated and had a couple episodes of nonbloody, nonbilious emesis.  She also endorses cloudy urine, reports she has been drinking plenty of liquids but urine still appears cloudy, no prior history of UTIs.  She does have a prior surgical history of removal of the cyst to her ovaries, also had a tubal ligation.  She denies any fever, sick contacts, or urinary symptoms.  The history is provided by the patient and medical records.  Abdominal Pain Associated symptoms: no chest pain, no dysuria, no fever, no hematuria, no shortness of breath, no sore throat, no vaginal bleeding and no vaginal discharge        Past Medical History:  Diagnosis Date  . Anemia    history  . Hypertension   . Ovarian cyst   . Palpitations    history -stress related  . SVD (spontaneous vaginal delivery) x 3  . Vaginal Pap smear, abnormal    LEEP    Patient Active Problem List   Diagnosis Date Noted  . Desires Sterilization 12/27/2013  . Dermoid cyst of right ovary 09/28/2013    Past Surgical History:  Procedure Laterality Date  . INDUCED ABORTION    . LAPAROSCOPY N/A 01/29/2014   Procedure: LAPAROSCOPIC RIGHT OVARIAN CYSTECTOMY, BILATERAL SALPINGECTOMY ;   Surgeon: Osborne Oman, MD;  Location: [facility];  Service: Gynecology;  Laterality: N/A;  . LEEP    . TONSILLECTOMY    . TUBAL LIGATION       OB History    Gravida  5   Para  3   Term  3   Preterm  0   AB  1   Living  3     SAB  0   TAB  1   Ectopic  0   Multiple  0   Live Births  3           Family History  Problem Relation Age of Onset  . Cancer Maternal Uncle   . Miscarriages / Stillbirths Maternal Grandmother   . Heart disease Maternal Grandmother   . Miscarriages / Stillbirths Maternal Grandfather   . Heart disease Maternal Grandfather   . Heart disease Paternal Grandfather   . Miscarriages / Stillbirths Paternal Grandfather     Social History   Tobacco Use  . Smoking status: Current Every Day Smoker    Packs/day: 0.50    Years: 14.00    Pack years: 7.00    Types: Cigarettes  . Smokeless tobacco: Never Used  Substance Use Topics  . Alcohol use: No  . Drug use: No    Home Medications Prior to Admission medications   Medication Sig Start Date End Date Taking?  Authorizing Provider  cephALEXin (KEFLEX) 500 MG capsule Take 1 capsule (500 mg total) by mouth 2 (two) times daily for 7 days. 08/13/19 08/20/19  Janeece Fitting, PA-C  ibuprofen (ADVIL,MOTRIN) 600 MG tablet Take 1 tablet (600 mg total) by mouth every 6 (six) hours as needed. 07/28/18   Fransico Meadow, PA-C  methocarbamol (ROBAXIN) 500 MG tablet Take 1 tablet (500 mg total) by mouth 2 (two) times daily. 07/28/18   Fransico Meadow, PA-C  naproxen sodium (ALEVE) 220 MG tablet Take 440 mg by mouth as needed (headache/pain).    [provider]    Allergies    Patient has no known allergies.  Review of Systems   Review of Systems  Constitutional: Negative for fever.  HENT: Negative for sore throat.   Respiratory: Negative for shortness of breath.   Cardiovascular: Negative for chest pain.  Gastrointestinal: Positive for abdominal pain.  Genitourinary: Negative for decreased urine  volume, difficulty urinating, dysuria, flank pain, hematuria, pelvic pain, urgency, vaginal bleeding, vaginal discharge and vaginal pain.  Musculoskeletal: Negative for back pain.  Skin: Negative for pallor and wound.  Neurological: Negative for light-headedness and headaches.    Physical Exam Updated Vital Signs BP (!) 144/99 (BP Location: Right Arm)   Pulse 98   Temp 99.4 F (37.4 C) (Oral)   Resp 15   Ht 5\' 10"  (1.778 m)   Wt 86.2 kg   LMP 07/21/2019   SpO2 100%   BMI 27.26 kg/m   Physical Exam Vitals and nursing note reviewed.  Constitutional:      Appearance: She is well-developed.  HENT:     Head: Normocephalic and atraumatic.     Mouth/Throat:     Mouth: Mucous membranes are moist.     Pharynx: Oropharynx is clear.  Cardiovascular:     Rate and Rhythm: Normal rate.  Pulmonary:     Effort: Pulmonary effort is normal.     Breath sounds: Normal breath sounds. No wheezing, rhonchi or rales.  Abdominal:     General: Bowel sounds are decreased. There is distension.     Palpations: Abdomen is soft.     Tenderness: There is abdominal tenderness in the left upper quadrant and left lower quadrant. There is no right CVA tenderness, left CVA tenderness or guarding. Negative signs include Murphy's sign and McBurney's sign.     Hernia: No hernia is present.  Skin:    General: Skin is warm and dry.  Neurological:     Mental Status: She is alert.     ED Results / Procedures / Treatments   Labs (all labs ordered are listed, but only abnormal results are displayed) Labs Reviewed  COMPREHENSIVE METABOLIC PANEL - Abnormal; Notable for the following components:      Result Value   Potassium 3.4 (*)    Glucose, Bld 114 (*)    Total Protein 8.2 (*)    AST 10 (*)    All other components within normal limits  URINALYSIS, ROUTINE W REFLEX MICROSCOPIC - Abnormal; Notable for the following components:   APPearance CLOUDY (*)    Hgb urine dipstick MODERATE (*)    Ketones, ur 15  (*)    Protein, ur 30 (*)    Nitrite POSITIVE (*)    Leukocytes,Ua MODERATE (*)    All other components within normal limits  URINALYSIS, MICROSCOPIC (REFLEX) - Abnormal; Notable for the following components:   Bacteria, UA MANY (*)    All other components within normal limits  URINE CULTURE  LIPASE, BLOOD  CBC  PREGNANCY, URINE    EKG None  Radiology CT ABDOMEN PELVIS W CONTRAST  Result Date: 08/13/2019 CLINICAL DATA:  Left lower quadrant pain for 2 weeks, progressive over the last 3 days. Nausea and vomiting. EXAM: CT ABDOMEN AND PELVIS WITH CONTRAST TECHNIQUE: Multidetector CT imaging of the abdomen and pelvis was performed using the standard protocol following bolus administration of intravenous contrast. CONTRAST:  169mL OMNIPAQUE IOHEXOL 300 MG/ML  SOLN COMPARISON:  None. FINDINGS: Lower chest: Normal. Hepatobiliary: 17 mm benign cyst in the left lobe of the liver. 10 mm benign cyst in the right lobe of the liver. Liver parenchyma is otherwise normal. Biliary tree is normal. Pancreas: Unremarkable. No pancreatic ductal dilatation or surrounding inflammatory changes. Spleen: Normal in size without focal abnormality. Adrenals/Urinary Tract: Adrenal glands are unremarkable. Kidneys are normal, without renal calculi, focal lesion, or hydronephrosis. Bladder is unremarkable. Stomach/Bowel: Stomach is within normal limits. Appendix appears normal. No evidence of bowel wall thickening, distention, or inflammatory changes. Specifically, no evidence of diverticulosis or diverticulitis. Vascular/Lymphatic: No significant vascular findings are present. No enlarged abdominal or pelvic lymph nodes. Reproductive: There is a small amount of fluid in the pelvis and around left adnexa. No ovarian mass. No focal uterine abnormality. Other: No abdominal wall hernia. Musculoskeletal: Degenerative disc disease at L5-S1 with a small broad-based disc protrusion. No acute bone abnormality. IMPRESSION: Small amount  of nonspecific fluid in the pelvis and around the left adnexa. Otherwise benign appearing abdomen and pelvis. Electronically Signed   By: Lorriane Shire M.D.   On: 08/13/2019 17:06    Procedures Procedures (including critical care time)  Medications Ordered in ED Medications  sodium chloride flush (NS) 0.9 % injection 3 mL (3 mLs Intravenous Not Given 08/13/19 1530)  fentaNYL (SUBLIMAZE) injection 50 mcg (50 mcg Intravenous Given 08/13/19 1537)  ondansetron (ZOFRAN) injection 4 mg (4 mg Intravenous Given 08/13/19 1538)  iohexol (OMNIPAQUE) 300 MG/ML solution 100 mL (100 mLs Intravenous Contrast Given 08/13/19 1647)    ED Course  I have reviewed the triage vital signs and the nursing notes.  Pertinent labs & imaging results that were available during my care of the patient were reviewed by me and considered in my medical decision making (see chart for details).    MDM Rules/Calculators/A&P   Patient with no pertinent past medical history presents to the ED with complaints of left lower quadrant pain which began 2 weeks ago.  She is also noted some cloudiness in her urine, does not have any dysuria, hematuria, difficulty urinating.  Has not been running any fevers, no left CVA noted.  She does report being somewhat constipated, this is normal for patient has had 1 bowel movement within 4 days.  During my evaluation patient is well-appearing, lungs are clear to auscultation, tenderness to palpation along the left flank, lower abdomen is soft.  All sounds are somewhat diminished.  Labs are within normal limits, temperature is 99.4, has not been taking any thing for his pain outside from Aleve.  Some suspicion of nephrolithiasis versus acute cystitis versus diverticulitis.  CMP doubt any electrolyte arise, creatinine level is within normal limits.  LFTs are within normal limits.  CBC without any leukocytosis, hemoglobin is normal.  Lipase level is normal.  UA showed many bacteria, greater than 50  white blood cell count, nitrite positive.  Moderate leukocytes, suspect patient likely suffering from acute cystitis.  Will obtain CT abdomen pelvis to rule out any further infection.  CT abdomen pelvis showed:  Small amount of nonspecific fluid in the pelvis and around the left  adnexa. Otherwise benign appearing abdomen and pelvis.       Patient received fentanyl in the ED, Zofran, reports improvement in her symptoms.  We discussed the results of her CT abdomen study at length, she is provided with a prescription for Keflex to treat her acute cystitis, lower suspicion for Pilo as patient has not been running any fevers, did have one episode of vomiting, not left CVA.  Patient understand that she will need to return to the ED if her symptoms worsen or do not improve.  Patient stable for discharge.    Portions of this note were generated with [employer]. Dictation errors may occur despite best attempts at proofreading.  Final Clinical Impression(s) / ED Diagnoses Final diagnoses:  Lower abdominal pain  Acute cystitis with hematuria    Rx / DC Orders ED Discharge Orders         Ordered    cephALEXin (KEFLEX) 500 MG capsule  2 times daily     08/13/19 1721           Janeece Fitting, PA-C 08/13/19 1733    Wyvonnia Dusky, MD 08/14/19 1133

## 2019-08-13 NOTE — Discharge Instructions (Addendum)
Your CT abdomen was within normal limits.  Your urinalysis did show signs of infection, I prescribed antibiotics to help treat this, please take 1 tablet twice a day for the next 7 days.  If you experience any fever, back pain, worsening symptoms please return to the emergency department.

## 2019-08-13 NOTE — ED Triage Notes (Signed)
LLQ abd pain x 3 days. Pt has associated N/V. Pt also reports cloudy urine.

## 2019-08-14 ENCOUNTER — Telehealth (HOSPITAL_BASED_OUTPATIENT_CLINIC_OR_DEPARTMENT_OTHER): Payer: Self-pay | Admitting: Emergency Medicine

## 2019-08-15 LAB — URINE CULTURE: Culture: >=100000 — AB

## 2019-08-16 ENCOUNTER — Telehealth: Payer: Self-pay | Admitting: *Deleted

## 2019-08-16 NOTE — Telephone Encounter (Signed)
Post ED Visit - Positive Culture Follow-up  Culture report reviewed by antimicrobial stewardship pharmacist: [facility] Team []  Elenor Quinones, Pharm.D. []  Heide Guile, Pharm.D., BCPS AQ-ID []  Parks Neptune, Pharm.D., BCPS []  Alycia Rossetti, Pharm.D., BCPS []  [place], Pharm.D., BCPS, AAHIVP []  Legrand Como, Pharm.D., BCPS, AAHIVP [x]  Salome Arnt, PharmD, BCPS []  Johnnette Gourd, PharmD, BCPS []  Hughes Better, PharmD, BCPS []  Leeroy Cha, PharmD []  Laqueta Linden, PharmD, BCPS []  Albertina Parr, PharmD  [facility] Team []  Leodis Sias, PharmD []  Lindell Spar, PharmD []  Royetta Asal, PharmD []  Graylin Shiver, Rph []  Rema Fendt) Glennon Mac, PharmD []  Arlyn Dunning, PharmD []  Netta Cedars, PharmD []  Dia Sitter, PharmD []  Leone Haven, PharmD []  Gretta Arab, PharmD []  Theodis Shove, PharmD []  Peggyann Juba, PharmD []  Reuel Boom, PharmD   Positive urine culture Treated with Cephalexin, organism sensitive to the same and no further patient follow-up is required at this time.  Harlon Flor [facility] 08/16/2019, 12:28 PM

## 2023-07-21 ENCOUNTER — Encounter: Payer: Self-pay | Admitting: Endocrinology

## 2023-07-21 ENCOUNTER — Ambulatory Visit (INDEPENDENT_AMBULATORY_CARE_PROVIDER_SITE_OTHER): Payer: No Typology Code available for payment source | Admitting: Endocrinology

## 2023-07-21 VITALS — BP 124/80 | HR 85 | Resp 16 | Ht 70.0 in | Wt 190.2 lb

## 2023-07-21 DIAGNOSIS — E041 Nontoxic single thyroid nodule: Secondary | ICD-10-CM | POA: Diagnosis not present

## 2023-07-21 NOTE — Progress Notes (Signed)
 Outpatient Endocrinology Note Allison Coplen, MD   Patient's Name: Allison Drake    DOB: 05-Oct-1980    MRN: [id number]  REASON OF VISIT: New consult for thyroid nodule  PCP: Patient, No Pcp Per  REFERRING PROVIDER: Constantine Mis, MD  HISTORY OF PRESENT ILLNESS:   Allison Drake is a 43 y.o. old female with past medical history as listed below is presented for new consult for thyroid nodule.  Pertinent Thyroid History:  Patient was diagnosed with thyroid nodule, initially noted to have thyromegaly on physical exam by primary care provider and subsequently had ultrasound thyroid on June 02, 2023 at [facility], noted to have left thyroid nodule measuring 1.6 cm solid and hypoechoic.  - Patient denies compressive symptoms including dysphagia, shortness of breath, neck pain or voice changes. - Patient denies hypo or hyperthyroid symptoms including heat or cold intolerance, sweating, palpitation, significant weight loss or gain, fatigue, change in bowel movements, hair loss, skin changes. - Patient denies family h/o thyroid cancer. - Patient denies h/o significant radiation exposure or radiation treatment in the neck.  - Thyroid US  on June 02, 2023: No images available to review. Report is scanned into media as follows Right lobe 5.2 x 2.1 x 1.8 cm, left lobe 5.2 x 1.9 x 1.9 cm. Isthmus 0.3 cm mildly heterogeneous. Nodule #1 maximum size 1.6 x 1.6 x 0.9 cm, left lower, solid, hypoechoic not taller than wide.  Isthmus margin.  Punctate echogenic foci present.  No additional thyroid nodules bilaterally.  Impression: The thyroid gland is not enlarged. There is a 1.6 cm left TR 5 nodule recommend endocrinology consult for consideration for FNA.   No prior thyroid function test results available on referral medical record.  Patient has never been on thyroid medication.  Interval history  Patient presented for evaluation and management of left thyroid  nodule diagnosed in November 2024.  REVIEW OF SYSTEMS:  As per history of present illness.   PAST MEDICAL HISTORY: Past Medical History:  Diagnosis Date   Anemia    history   Hypertension    Ovarian cyst    Palpitations    history -stress related   SVD (spontaneous vaginal delivery) x 3   Vaginal Pap smear, abnormal    LEEP    PAST SURGICAL HISTORY: Past Surgical History:  Procedure Laterality Date   INDUCED ABORTION     LAPAROSCOPY N/A 01/29/2014   Procedure: LAPAROSCOPIC RIGHT OVARIAN CYSTECTOMY, BILATERAL SALPINGECTOMY ;  Surgeon: Gloris DELENA Hugger, MD;  Location: [facility];  Service: Gynecology;  Laterality: N/A;   LEEP     TONSILLECTOMY     TUBAL LIGATION      ALLERGIES: No Known Allergies  FAMILY HISTORY:  Family History  Problem Relation Age of Onset   Cancer Maternal Uncle    Miscarriages / Stillbirths Maternal Grandmother    Heart disease Maternal Grandmother    Miscarriages / Stillbirths Maternal Grandfather    Heart disease Maternal Grandfather    Heart disease Paternal Grandfather    Miscarriages / Stillbirths Paternal Grandfather     SOCIAL HISTORY: Social History   Socioeconomic History   Marital status: Single    Spouse name: Not on file   Number of children: Not on file   Years of education: Not on file   Highest education level: Not on file  Occupational History   Not on file  Tobacco Use   Smoking status: Every Day    Current packs/day: 0.50  Average packs/day: 0.5 packs/day for 14.0 years (7.0 ttl pk-yrs)    Types: Cigarettes   Smokeless tobacco: Never  Vaping Use   Vaping status: Never Used  Substance and Sexual Activity   Alcohol use: No   Drug use: No   Sexual activity: Yes    Birth control/protection: None  Other Topics Concern   Not on file  Social History Narrative   Not on file   Social Drivers of Health   Financial Resource Strain: Not on file  Food Insecurity: Not on file  Transportation Needs: Not on file   Physical Activity: Not on file  Stress: Not on file  Social Connections: Not on file    MEDICATIONS:  Current Outpatient Medications  Medication Sig Dispense Refill   ibuprofen  (ADVIL ,MOTRIN ) 600 MG tablet Take 1 tablet (600 mg total) by mouth every 6 (six) hours as needed. 30 tablet 0   methocarbamol  (ROBAXIN ) 500 MG tablet Take 1 tablet (500 mg total) by mouth 2 (two) times daily. 20 tablet 0   naproxen sodium (ALEVE) 220 MG tablet Take 440 mg by mouth as needed (headache/pain).     No current facility-administered medications for this visit.    PHYSICAL EXAM: Vitals:   07/21/23 1344  BP: 124/80  Pulse: 85  Resp: 16  SpO2: 99%  Weight: 190 lb 3.2 oz (86.3 kg)  Height: 5' 10 (1.778 m)   Body mass index is 27.29 kg/m.  Wt Readings from Last 3 Encounters:  07/21/23 190 lb 3.2 oz (86.3 kg)  08/13/19 190 lb (86.2 kg)  07/28/18 190 lb (86.2 kg)     General: Well developed, well nourished female in no apparent distress.  HEENT: AT/, no external lesions. Hearing intact to the spoken word Eyes: EOMI. Conjunctiva clear and no icterus. Neck: Trachea midline, neck supple without appreciable thyromegaly or lymphadenopathy and left palpable thyroid nodule  + Lungs: Clear to auscultation, no wheeze. Respirations not labored Heart: S1S2, Regular in rate and rhythm. No loud murmurs Abdomen: Soft, non tender, non distended, no striae Neurologic: Alert, oriented, normal speech, deep tendon biceps reflexes normal,  no gross focal neurological deficit Extremities: No pedal pitting edema, no tremors of outstretched hands Skin: Warm, color good.  Psychiatric: Does not appear depressed or anxious  PERTINENT HISTORIC LABORATORY AND IMAGING STUDIES:  All pertinent laboratory results were reviewed. Please see HPI also for further details.   No results found for: TSH, T3TOTAL   ASSESSMENT / PLAN  1. Left thyroid nodule     - No compressive symptoms, euthyroid, no family h/o  thyroid cancer, no h/o radiation exposure. -Patient has left thyroid nodule measuring 1.6 cm solid hypoechoic on ultrasound in November 2024 at outside medical facility [facility] [place].  No images available to review.  Plan: -Will get records of ultrasound images. -Refer to IR for FNA of left thyroid nodule measuring 1.6 cm. -Check thyroid function test today. -Provided FNA cytology is benign we will repeat ultrasound thyroid in 6 to 12 months.   Thyroid nodule / FNA talk: -Approximately 5% of individuals have palpable thyroid nodules and 30% or more of adults have non-palpable nodules. The prevalence of nodules increases with age, so that perhaps 50% of individuals older than 45 years of age have nodules. Many of these nodules will be well under 1cm in size.  -The incidence of thyroid cancer is low, but rising. The prevalence of thyroid cancer is low compared with the prevalence of thyroid nodule.  -There have been  a number of studies that have estimated risk of malignancy in thyroid nodule. The estimated risk varies with size and other characteristics of nodules selected for biopsy, the technique used for the biopsy, the institution and its referral patterns, and the characteristics of the local population. Most studies have estimated malignancy risk in nodules that are palpable or > 1cm on U/S to be in the range of 3-15%.    - In general, options for further evaluation include: - Follow with serial U/S - Fine needle aspiration biopsy - Thyroidectomy - In general the criteria for FNA biopsy includes:  - All palpable nodules - Generally nodules > 1 and for some nodules up to > 1 to 2 cm, based on other criteria.  - Rarely Nodules > 8-83mm in two or more dimensions with high risk sonographic features, or occuring in patients with high risk historical features. Nodules not meeting the above criteria can generally be followed with serial ultrasounds.  -Thyroid FNA is required for  further evaluation of nodule to see if suspicious for cancer which may require further surgical treatment. -There are false positive and false negative results with FNA and need for repeat FNA or surgical resection in some cases and also possibility of missing a diagnosis of Cancer in 5-15% of cases. -discussed the risk and benefits of procedure and potential complications such as bleeding, pain in neck, swelling in neck in rare cases due to hematoma formation and causing respiratory compromise, possibility of infection etc. -discussed the need for continued follow up even if the nodule were found to be benign on FNA.  -discussed that the only near 100% method of ruling out thyroid cancer is by surgical resection. -patient agreeable for Thyroid FNA.   You may read about thyroid nodule from this link of American Thyroid Association.  sociallisting.com.br  We have reviewed our plan outlined above with the patient verbalizes understanding.    All questions were answered.   Diagnoses and all orders for this visit:  Left thyroid nodule -     T4, free -     TSH -     US  FNA BX THYROID 1ST LESION AFIRMA; Future    DISPOSITION Follow up in clinic in 4 months suggested.  All questions answered and patient verbalized understanding of the plan.  Maybell Misenheimer, MD [facility] Endocrinology [facility] [address] [place], [address] [place], [place] [address] Phone # [phone]  At least part of this note was generated using voice recognition software. Inadvertent word errors may have occurred, which were not recognized during the proofreading process.

## 2023-07-22 LAB — T4, FREE: Free T4: 1.2 ng/dL (ref 0.8–1.8)

## 2023-07-22 LAB — TSH: TSH: 0.64 m[IU]/L

## 2023-11-21 ENCOUNTER — Ambulatory Visit: Payer: No Typology Code available for payment source | Admitting: Endocrinology
